# Patient Record
Sex: Female | Born: 1935 | Race: White | Hispanic: No | Smoking: Former smoker
Health system: Southern US, Community
[De-identification: ages and names within clinical notes are randomized; demographics above are authoritative.]

## PROBLEM LIST (undated history)

## (undated) DIAGNOSIS — M8580 Other specified disorders of bone density and structure, unspecified site: Secondary | ICD-10-CM

## (undated) DIAGNOSIS — Z973 Presence of spectacles and contact lenses: Secondary | ICD-10-CM

## (undated) DIAGNOSIS — I10 Essential (primary) hypertension: Secondary | ICD-10-CM

## (undated) DIAGNOSIS — M1990 Unspecified osteoarthritis, unspecified site: Secondary | ICD-10-CM

## (undated) DIAGNOSIS — E0590 Thyrotoxicosis, unspecified without thyrotoxic crisis or storm: Secondary | ICD-10-CM

## (undated) DIAGNOSIS — N950 Postmenopausal bleeding: Secondary | ICD-10-CM

## (undated) DIAGNOSIS — N840 Polyp of corpus uteri: Secondary | ICD-10-CM

## (undated) DIAGNOSIS — H269 Unspecified cataract: Secondary | ICD-10-CM

## (undated) DIAGNOSIS — E039 Hypothyroidism, unspecified: Secondary | ICD-10-CM

## (undated) DIAGNOSIS — H3530 Unspecified macular degeneration: Secondary | ICD-10-CM

## (undated) HISTORY — PX: COLONOSCOPY: SHX174

## (undated) HISTORY — DX: Other specified disorders of bone density and structure, unspecified site: M85.80

## (undated) HISTORY — DX: Essential (primary) hypertension: I10

## (undated) HISTORY — DX: Thyrotoxicosis, unspecified without thyrotoxic crisis or storm: E05.90

## (undated) HISTORY — DX: Unspecified cataract: H26.9

## (undated) HISTORY — DX: Unspecified osteoarthritis, unspecified site: M19.90

## (undated) HISTORY — PX: TOTAL HIP ARTHROPLASTY: SHX124

---

## 1998-04-13 ENCOUNTER — Ambulatory Visit (HOSPITAL_COMMUNITY): Admission: RE | Admit: 1998-04-13 | Discharge: 1998-04-13 | Payer: Self-pay | Admitting: Neurosurgery

## 1998-04-13 ENCOUNTER — Encounter: Payer: Self-pay | Admitting: Neurosurgery

## 1999-01-20 ENCOUNTER — Encounter: Admission: RE | Admit: 1999-01-20 | Discharge: 1999-01-20 | Payer: Self-pay | Admitting: Internal Medicine

## 1999-01-20 ENCOUNTER — Encounter: Payer: Self-pay | Admitting: Internal Medicine

## 2000-01-31 ENCOUNTER — Encounter: Admission: RE | Admit: 2000-01-31 | Discharge: 2000-01-31 | Payer: Self-pay | Admitting: Internal Medicine

## 2000-01-31 ENCOUNTER — Encounter: Payer: Self-pay | Admitting: Internal Medicine

## 2001-02-05 ENCOUNTER — Encounter: Payer: Self-pay | Admitting: Internal Medicine

## 2001-02-05 ENCOUNTER — Encounter: Admission: RE | Admit: 2001-02-05 | Discharge: 2001-02-05 | Payer: Self-pay | Admitting: Internal Medicine

## 2001-02-12 ENCOUNTER — Other Ambulatory Visit: Admission: RE | Admit: 2001-02-12 | Discharge: 2001-02-12 | Payer: Self-pay | Admitting: Internal Medicine

## 2002-02-06 ENCOUNTER — Encounter: Admission: RE | Admit: 2002-02-06 | Discharge: 2002-02-06 | Payer: Self-pay | Admitting: Internal Medicine

## 2002-02-06 ENCOUNTER — Encounter: Payer: Self-pay | Admitting: Internal Medicine

## 2003-03-11 ENCOUNTER — Encounter: Admission: RE | Admit: 2003-03-11 | Discharge: 2003-03-11 | Payer: Self-pay | Admitting: Internal Medicine

## 2004-02-17 ENCOUNTER — Other Ambulatory Visit: Admission: RE | Admit: 2004-02-17 | Discharge: 2004-02-17 | Payer: Self-pay | Admitting: Internal Medicine

## 2004-03-14 ENCOUNTER — Ambulatory Visit (HOSPITAL_COMMUNITY): Admission: RE | Admit: 2004-03-14 | Discharge: 2004-03-14 | Payer: Self-pay | Admitting: Internal Medicine

## 2005-03-06 HISTORY — PX: TOTAL HIP ARTHROPLASTY: SHX124

## 2005-03-28 ENCOUNTER — Ambulatory Visit (HOSPITAL_COMMUNITY): Admission: RE | Admit: 2005-03-28 | Discharge: 2005-03-28 | Payer: Self-pay | Admitting: Internal Medicine

## 2005-12-18 ENCOUNTER — Inpatient Hospital Stay (HOSPITAL_COMMUNITY): Admission: EM | Admit: 2005-12-18 | Discharge: 2005-12-21 | Payer: Self-pay | Admitting: Emergency Medicine

## 2006-03-29 ENCOUNTER — Ambulatory Visit (HOSPITAL_COMMUNITY): Admission: RE | Admit: 2006-03-29 | Discharge: 2006-03-29 | Payer: Self-pay | Admitting: Internal Medicine

## 2006-10-13 ENCOUNTER — Encounter: Admission: RE | Admit: 2006-10-13 | Discharge: 2006-10-13 | Payer: Self-pay | Admitting: Internal Medicine

## 2007-04-01 ENCOUNTER — Ambulatory Visit (HOSPITAL_COMMUNITY): Admission: RE | Admit: 2007-04-01 | Discharge: 2007-04-01 | Payer: Self-pay | Admitting: Internal Medicine

## 2007-04-25 ENCOUNTER — Other Ambulatory Visit: Admission: RE | Admit: 2007-04-25 | Discharge: 2007-04-25 | Payer: Self-pay | Admitting: Internal Medicine

## 2007-12-24 ENCOUNTER — Ambulatory Visit: Payer: Self-pay | Admitting: Internal Medicine

## 2008-01-07 ENCOUNTER — Encounter: Payer: Self-pay | Admitting: Internal Medicine

## 2008-01-07 ENCOUNTER — Ambulatory Visit: Payer: Self-pay | Admitting: Internal Medicine

## 2008-01-09 ENCOUNTER — Encounter: Payer: Self-pay | Admitting: Internal Medicine

## 2008-04-01 ENCOUNTER — Ambulatory Visit (HOSPITAL_COMMUNITY): Admission: RE | Admit: 2008-04-01 | Discharge: 2008-04-01 | Payer: Self-pay | Admitting: Internal Medicine

## 2008-04-06 ENCOUNTER — Encounter: Admission: RE | Admit: 2008-04-06 | Discharge: 2008-04-06 | Payer: Self-pay | Admitting: Internal Medicine

## 2009-04-07 ENCOUNTER — Ambulatory Visit (HOSPITAL_COMMUNITY): Admission: RE | Admit: 2009-04-07 | Discharge: 2009-04-07 | Payer: Self-pay | Admitting: Internal Medicine

## 2009-04-20 ENCOUNTER — Ambulatory Visit (HOSPITAL_COMMUNITY): Admission: RE | Admit: 2009-04-20 | Discharge: 2009-04-20 | Payer: Self-pay | Admitting: Internal Medicine

## 2009-07-04 ENCOUNTER — Inpatient Hospital Stay (HOSPITAL_COMMUNITY): Admission: EM | Admit: 2009-07-04 | Discharge: 2009-07-07 | Payer: Self-pay | Admitting: Emergency Medicine

## 2010-03-06 HISTORY — PX: CATARACT EXTRACTION, BILATERAL: SHX1313

## 2010-03-27 ENCOUNTER — Encounter: Payer: Self-pay | Admitting: Internal Medicine

## 2010-03-31 ENCOUNTER — Other Ambulatory Visit (HOSPITAL_COMMUNITY): Payer: Self-pay | Admitting: Internal Medicine

## 2010-03-31 DIAGNOSIS — Z1239 Encounter for other screening for malignant neoplasm of breast: Secondary | ICD-10-CM

## 2010-04-12 ENCOUNTER — Ambulatory Visit (HOSPITAL_COMMUNITY)
Admission: RE | Admit: 2010-04-12 | Discharge: 2010-04-12 | Disposition: A | Discharge status: Home / Self Care | Payer: Medicare Other | Source: Ambulatory Visit | Attending: Internal Medicine | Admitting: Internal Medicine

## 2010-04-12 DIAGNOSIS — Z1231 Encounter for screening mammogram for malignant neoplasm of breast: Secondary | ICD-10-CM

## 2010-04-12 DIAGNOSIS — Z1239 Encounter for other screening for malignant neoplasm of breast: Secondary | ICD-10-CM

## 2010-05-24 LAB — CROSSMATCH: Antibody Screen: POSITIVE

## 2010-05-24 LAB — COMPREHENSIVE METABOLIC PANEL
ALT: 18 U/L (ref 0–35)
Albumin: 3.6 g/dL (ref 3.5–5.2)
Alkaline Phosphatase: 40 U/L (ref 39–117)
BUN: 33 mg/dL — ABNORMAL HIGH (ref 6–23)
Chloride: 104 mEq/L (ref 96–112)
Glucose, Bld: 127 mg/dL — ABNORMAL HIGH (ref 70–99)
Potassium: 3.7 mEq/L (ref 3.5–5.1)
Sodium: 140 mEq/L (ref 135–145)
Total Bilirubin: 0.7 mg/dL (ref 0.3–1.2)
Total Protein: 7 g/dL (ref 6.0–8.3)

## 2010-05-24 LAB — BASIC METABOLIC PANEL
BUN: 18 mg/dL (ref 6–23)
CO2: 28 mEq/L (ref 19–32)
Calcium: 8.2 mg/dL — ABNORMAL LOW (ref 8.4–10.5)
Calcium: 8.2 mg/dL — ABNORMAL LOW (ref 8.4–10.5)
Creatinine, Ser: 0.86 mg/dL (ref 0.4–1.2)
GFR calc non Af Amer: 52 mL/min — ABNORMAL LOW (ref >60)
GFR calc non Af Amer: >60 mL/min (ref >60)
Glucose, Bld: 145 mg/dL — ABNORMAL HIGH (ref 70–99)
Glucose, Bld: 94 mg/dL (ref 70–99)
Potassium: 3.9 mEq/L (ref 3.5–5.1)
Sodium: 140 mEq/L (ref 135–145)

## 2010-05-24 LAB — URINALYSIS, ROUTINE W REFLEX MICROSCOPIC
Glucose, UA: NEGATIVE mg/dL
Protein, ur: NEGATIVE mg/dL
pH: 6.5 (ref 5.0–8.0)

## 2010-05-24 LAB — URINE CULTURE
Colony Count: NO GROWTH
Culture: NO GROWTH

## 2010-05-24 LAB — CBC
HCT: 29.2 % — ABNORMAL LOW (ref 36.0–46.0)
HCT: 37.1 % (ref 36.0–46.0)
Hemoglobin: 8.9 g/dL — ABNORMAL LOW (ref 12.0–15.0)
Hemoglobin: 9.8 g/dL — ABNORMAL LOW (ref 12.0–15.0)
MCHC: 33.4 g/dL (ref 30.0–36.0)
MCHC: 34.3 g/dL (ref 30.0–36.0)
MCV: 97.3 fL (ref 78.0–100.0)
MCV: 97.5 fL (ref 78.0–100.0)
Platelets: 176 10*3/uL (ref 150–400)
RBC: 3 MIL/uL — ABNORMAL LOW (ref 3.87–5.11)
RBC: 3.81 MIL/uL — ABNORMAL LOW (ref 3.87–5.11)
RDW: 14.6 % (ref 11.5–15.5)
RDW: 14.7 % (ref 11.5–15.5)
WBC: 17.4 10*3/uL — ABNORMAL HIGH (ref 4.0–10.5)

## 2010-05-24 LAB — DIFFERENTIAL
Eosinophils Absolute: 0 10*3/uL (ref 0.0–0.7)
Eosinophils Relative: 0 % (ref 0–5)
Lymphocytes Relative: 9 % — ABNORMAL LOW (ref 12–46)
Lymphs Abs: 1.5 10*3/uL (ref 0.7–4.0)
Monocytes Relative: 4 % (ref 3–12)
Neutrophils Relative %: 87 % — ABNORMAL HIGH (ref 43–77)

## 2010-05-24 LAB — SAMPLE TO BLOOD BANK

## 2010-05-24 LAB — URINE MICROSCOPIC-ADD ON
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
## 2011-03-07 HISTORY — PX: TOTAL HIP ARTHROPLASTY: SHX124

## 2011-03-13 ENCOUNTER — Other Ambulatory Visit (HOSPITAL_COMMUNITY): Payer: Self-pay | Admitting: Internal Medicine

## 2011-03-13 DIAGNOSIS — Z1231 Encounter for screening mammogram for malignant neoplasm of breast: Secondary | ICD-10-CM

## 2011-04-14 ENCOUNTER — Ambulatory Visit (HOSPITAL_COMMUNITY): Payer: Medicare Other

## 2011-05-03 ENCOUNTER — Ambulatory Visit (HOSPITAL_COMMUNITY)
Admission: RE | Admit: 2011-05-03 | Discharge: 2011-05-03 | Disposition: A | Discharge status: Home / Self Care | Payer: Medicare Other | Source: Ambulatory Visit | Attending: Internal Medicine | Admitting: Internal Medicine

## 2011-05-03 DIAGNOSIS — Z1231 Encounter for screening mammogram for malignant neoplasm of breast: Secondary | ICD-10-CM | POA: Insufficient documentation

## 2012-04-22 ENCOUNTER — Other Ambulatory Visit (HOSPITAL_COMMUNITY): Payer: Self-pay | Admitting: Internal Medicine

## 2012-04-22 DIAGNOSIS — Z1231 Encounter for screening mammogram for malignant neoplasm of breast: Secondary | ICD-10-CM

## 2012-05-06 ENCOUNTER — Ambulatory Visit (HOSPITAL_COMMUNITY): Payer: Medicare Other

## 2012-05-15 ENCOUNTER — Ambulatory Visit (HOSPITAL_COMMUNITY)
Admission: RE | Admit: 2012-05-15 | Discharge: 2012-05-15 | Disposition: A | Discharge status: Home / Self Care | Payer: Medicare Other | Source: Ambulatory Visit | Attending: Internal Medicine | Admitting: Internal Medicine

## 2012-05-15 DIAGNOSIS — Z1231 Encounter for screening mammogram for malignant neoplasm of breast: Secondary | ICD-10-CM | POA: Insufficient documentation

## 2012-07-23 ENCOUNTER — Telehealth: Payer: Self-pay | Admitting: *Deleted

## 2012-07-23 DIAGNOSIS — Z1211 Encounter for screening for malignant neoplasm of colon: Secondary | ICD-10-CM

## 2012-07-23 NOTE — Telephone Encounter (Signed)
Spoke with patient and scheduled direct colonoscopy on 08/21/12 at 2:30 PM and pre visit on 08/13/12.

## 2012-07-23 NOTE — Telephone Encounter (Signed)
Per Dr. Juanda Chance, patient needs to have direct colonoscopy. Dr. Nila Nephew called and requested due to feeling something in rectum. Called and left a message for patient to call me.

## 2012-07-23 NOTE — Telephone Encounter (Signed)
Spoke with patient's family and they will have her call me.

## 2012-07-24 ENCOUNTER — Encounter: Payer: Self-pay | Admitting: Internal Medicine

## 2012-08-13 ENCOUNTER — Ambulatory Visit (AMBULATORY_SURGERY_CENTER): Payer: Medicare Other | Admitting: *Deleted

## 2012-08-13 VITALS — Ht 63.0 in | Wt 115.4 lb

## 2012-08-13 DIAGNOSIS — Z1211 Encounter for screening for malignant neoplasm of colon: Secondary | ICD-10-CM

## 2012-08-13 DIAGNOSIS — Z860100 Personal history of colon polyps, unspecified: Secondary | ICD-10-CM

## 2012-08-13 MED ORDER — MOVIPREP 100 G PO SOLR: 1.0000 | Freq: Once | ORAL | Status: DC

## 2012-08-13 NOTE — Progress Notes (Signed)
Denies allergies to eggs or soy products. Denies complications with anesthesia or sedation. 

## 2012-08-21 ENCOUNTER — Encounter: Payer: Self-pay | Admitting: Internal Medicine

## 2012-08-21 ENCOUNTER — Ambulatory Visit (AMBULATORY_SURGERY_CENTER): Payer: Medicare Other | Admitting: Internal Medicine

## 2012-08-21 VITALS — BP 126/72 | HR 75 | Temp 97.8°F | Resp 16 | Ht 63.0 in | Wt 115.0 lb

## 2012-08-21 DIAGNOSIS — D126 Benign neoplasm of colon, unspecified: Secondary | ICD-10-CM

## 2012-08-21 DIAGNOSIS — Z1211 Encounter for screening for malignant neoplasm of colon: Secondary | ICD-10-CM

## 2012-08-21 DIAGNOSIS — Z86010 Personal history of colonic polyps: Secondary | ICD-10-CM

## 2012-08-21 MED ORDER — SODIUM CHLORIDE 0.9 % IV SOLN: 500.0000 mL | INTRAVENOUS | Status: DC

## 2012-08-21 NOTE — Progress Notes (Signed)
Called to room to assist during endoscopic procedure.  Patient ID and intended procedure confirmed with present staff. Received instructions for my participation in the procedure from the performing physician.  

## 2012-08-21 NOTE — Progress Notes (Signed)
Pt stable to RR 

## 2012-08-21 NOTE — Progress Notes (Signed)
Patient did not experience any of the following events: a burn prior to discharge; a fall within the facility; wrong site/side/patient/procedure/implant event; or a hospital transfer or hospital admission upon discharge from the facility. (G8907) Patient did not have preoperative order for IV antibiotic SSI prophylaxis. (G8918)  

## 2012-08-21 NOTE — Patient Instructions (Addendum)
METAMUCIL 1 TABLESPOON DAILY.   YOU HAD AN ENDOSCOPIC PROCEDURE TODAY AT THE [facility]: Refer to the procedure report that was given to you for any specific questions about what was found during the examination.  If the procedure report does not answer your questions, please call your gastroenterologist to clarify.  If you requested that your care partner not be given the details of your procedure findings, then the procedure report has been included in a sealed envelope for you to review at your convenience later.  YOU SHOULD EXPECT: Some feelings of bloating in the abdomen. Passage of more gas than usual.  Walking can help get rid of the air that was put into your GI tract during the procedure and reduce the bloating. If you had a lower endoscopy (such as a colonoscopy or flexible sigmoidoscopy) you may notice spotting of blood in your stool or on the toilet paper. If you underwent a bowel prep for your procedure, then you may not have a normal bowel movement for a few days.  DIET: Your first meal following the procedure should be a light meal and then it is ok to progress to your normal diet.  A half-sandwich or bowl of soup is an example of a good first meal.  Heavy or fried foods are harder to digest and may make you feel nauseous or bloated.  Likewise meals heavy in dairy and vegetables can cause extra gas to form and this can also increase the bloating.  Drink plenty of fluids but you should avoid alcoholic beverages for 24 hours.  ACTIVITY: Your care partner should take you home directly after the procedure.  You should plan to take it easy, moving slowly for the rest of the day.  You can resume normal activity the day after the procedure however you should NOT DRIVE or use heavy machinery for 24 hours (because of the sedation medicines used during the test).    SYMPTOMS TO REPORT IMMEDIATELY: A gastroenterologist can be reached at any hour.  During normal business hours, 8:30 AM  to 5:00 PM Monday through Friday, call [phone].  After hours and on weekends, please call the GI answering service at [phone] who will take a message and have the physician on call contact you.   Following lower endoscopy (colonoscopy or flexible sigmoidoscopy):  Excessive amounts of blood in the stool  Significant tenderness or worsening of abdominal pains  Swelling of the abdomen that is new, acute  Fever of 100F or higher  FOLLOW UP: If any biopsies were taken you will be contacted by phone or by letter within the next 1-3 weeks.  Call your gastroenterologist if you have not heard about the biopsies in 3 weeks.  Our staff will call the home number listed on your records the next business day following your procedure to check on you and address any questions or concerns that you may have at that time regarding the information given to you following your procedure. This is a courtesy call and so if there is no answer at the home number and we have not heard from you through the emergency physician on call, we will assume that you have returned to your regular daily activities without incident.  SIGNATURES/CONFIDENTIALITY: You and/or your care partner have signed paperwork which will be entered into your electronic medical record.  These signatures attest to the fact that that the information above on your After Visit Summary has been reviewed and is understood.  Full  responsibility of the confidentiality of this discharge information lies with you and/or your care-partner.

## 2012-08-21 NOTE — Op Note (Signed)
[facility] [address] [place], [address]   COLONOSCOPY PROCEDURE REPORT  PATIENT: Jenna Thomas, Jenna Thomas  MR#: [id number] BIRTHDATE: 1931-12-23 , 81  yrs. old GENDER: Female ENDOSCOPIST: Hart Carwin, MD REFERRED BY:  Nila Nephew, M.D. PROCEDURE DATE:  08/21/2012 PROCEDURE:   Colonoscopy with snare polypectomy ASA CLASS:   Class II INDICATIONS:adenomatous polyp in 2004 and in 2009, Dr Chilton Si did a recent rectal exam and felt a questionable abnormality.Marland Kitchen MEDICATIONS: MAC sedation, administered by CRNA and Propofol (Diprivan) 260 mg IV  DESCRIPTION OF PROCEDURE:   After the risks and benefits and of the procedure were explained, informed consent was obtained.  A digital rectal exam revealed decreased sphincter tone.    The LB [id number]-H190 [id number]  endoscope was introduced through the anus and advanced to the cecum, which was identified by both the appendix and ileocecal valve .  The quality of the prep was Moviprep fair .  The instrument was then slowly withdrawn as the colon was fully examined.     COLON FINDINGS: A polypoid shaped sessile polyp ranging between 5-37mm in size was found in the rectum.  A polypectomy was performed with a cold snare.  The resection was complete and the polyp tissue was completely retrieved.   There was severe diverticulosis noted in the sigmoid colon with associated angulation, tortuosity, muscular hypertrophy, luminal narrowing and colonic spasm. Retroflexed views revealed no abnormalities.     The scope was then withdrawn from the patient and the procedure completed.  COMPLICATIONS: There were no complications. ENDOSCOPIC IMPRESSION: 1.   Sessile polyp ranging between 5-19mm in size was found in the rectum; polypectomy was performed with a cold snare - polyp does not represents the abnormality  felt on rectal digital exam. 2.   There was severe diverticulosis noted in the sigmoid colon with severe spasm and partial  obstruction, no evidence of diverticulitis 3. the abnormality in rectal ampulla may  be a prominent coccygeal process which appears to protrude  into the rectal ampulla,  RECOMMENDATIONS: 1.await pathology report 2.Metamucil 1 tablesp daily  REPEAT EXAM: consider recall 5 years depending on pt's medical status  cc:  _______________________________ eSignedHart Carwin, MD 08/21/2012 3:12 PM     PATIENT NAME:  [name], [name] MR#: [id number]

## 2012-08-22 ENCOUNTER — Telehealth: Payer: Self-pay

## 2012-08-22 NOTE — Telephone Encounter (Signed)
  Follow up Call-  Call back number 08/21/2012  Post procedure Call Back phone  # [phone]  Permission to leave phone message Yes     Patient questions:  Do you have a fever, pain , or abdominal swelling? no Pain Score  0 *  Have you tolerated food without any problems? yes  Have you been able to return to your normal activities? yes  Do you have any questions about your discharge instructions: Diet   no Medications  no Follow up visit  no  Do you have questions or concerns about your Care? no  Actions: * If pain score is 4 or above: No action needed, pain <4.

## 2012-08-27 ENCOUNTER — Encounter: Payer: Self-pay | Admitting: Internal Medicine

## 2013-05-02 ENCOUNTER — Other Ambulatory Visit (HOSPITAL_COMMUNITY): Payer: Self-pay | Admitting: Internal Medicine

## 2013-05-02 DIAGNOSIS — Z1231 Encounter for screening mammogram for malignant neoplasm of breast: Secondary | ICD-10-CM

## 2013-05-20 ENCOUNTER — Ambulatory Visit (HOSPITAL_COMMUNITY)
Admission: RE | Admit: 2013-05-20 | Discharge: 2013-05-20 | Disposition: A | Discharge status: Home / Self Care | Payer: Medicare Other | Source: Ambulatory Visit | Attending: Internal Medicine | Admitting: Internal Medicine

## 2013-05-20 DIAGNOSIS — Z1231 Encounter for screening mammogram for malignant neoplasm of breast: Secondary | ICD-10-CM

## 2013-06-22 ENCOUNTER — Emergency Department (HOSPITAL_COMMUNITY)
Admission: EM | Admit: 2013-06-22 | Discharge: 2013-06-22 | Disposition: A | Discharge status: Home / Self Care | Payer: Medicare Other | Attending: Emergency Medicine | Admitting: Emergency Medicine

## 2013-06-22 ENCOUNTER — Emergency Department (HOSPITAL_COMMUNITY): Payer: Medicare Other

## 2013-06-22 ENCOUNTER — Encounter (HOSPITAL_COMMUNITY): Payer: Self-pay | Admitting: Emergency Medicine

## 2013-06-22 DIAGNOSIS — E0590 Thyrotoxicosis, unspecified without thyrotoxic crisis or storm: Secondary | ICD-10-CM | POA: Insufficient documentation

## 2013-06-22 DIAGNOSIS — M949 Disorder of cartilage, unspecified: Secondary | ICD-10-CM

## 2013-06-22 DIAGNOSIS — G8929 Other chronic pain: Secondary | ICD-10-CM | POA: Insufficient documentation

## 2013-06-22 DIAGNOSIS — IMO0002 Reserved for concepts with insufficient information to code with codable children: Secondary | ICD-10-CM | POA: Insufficient documentation

## 2013-06-22 DIAGNOSIS — Z87891 Personal history of nicotine dependence: Secondary | ICD-10-CM | POA: Insufficient documentation

## 2013-06-22 DIAGNOSIS — Z79899 Other long term (current) drug therapy: Secondary | ICD-10-CM | POA: Insufficient documentation

## 2013-06-22 DIAGNOSIS — M5416 Radiculopathy, lumbar region: Secondary | ICD-10-CM

## 2013-06-22 DIAGNOSIS — I10 Essential (primary) hypertension: Secondary | ICD-10-CM | POA: Insufficient documentation

## 2013-06-22 DIAGNOSIS — M479 Spondylosis, unspecified: Secondary | ICD-10-CM | POA: Insufficient documentation

## 2013-06-22 DIAGNOSIS — M25552 Pain in left hip: Secondary | ICD-10-CM

## 2013-06-22 DIAGNOSIS — Z8669 Personal history of other diseases of the nervous system and sense organs: Secondary | ICD-10-CM | POA: Insufficient documentation

## 2013-06-22 DIAGNOSIS — M25559 Pain in unspecified hip: Secondary | ICD-10-CM | POA: Insufficient documentation

## 2013-06-22 DIAGNOSIS — Z96649 Presence of unspecified artificial hip joint: Secondary | ICD-10-CM | POA: Insufficient documentation

## 2013-06-22 DIAGNOSIS — M899 Disorder of bone, unspecified: Secondary | ICD-10-CM | POA: Insufficient documentation

## 2013-06-22 MED ORDER — HYDROCODONE-ACETAMINOPHEN 5-325 MG PO TABS
1.0000 | ORAL_TABLET | Freq: Once | ORAL | Status: AC
  Administered 2013-06-22: 1 via ORAL
  Filled 2013-06-22: qty 1

## 2013-06-22 MED ORDER — ONDANSETRON HCL 4 MG PO TABS: 4.0000 mg | ORAL_TABLET | Freq: Three times a day (TID) | ORAL | Status: DC | PRN

## 2013-06-22 MED ORDER — HYDROCODONE-ACETAMINOPHEN 5-325 MG PO TABS: 1.0000 | ORAL_TABLET | Freq: Four times a day (QID) | ORAL | Status: DC | PRN

## 2013-06-22 MED ORDER — ONDANSETRON 8 MG PO TBDP
8.0000 mg | ORAL_TABLET | Freq: Once | ORAL | Status: AC
  Administered 2013-06-22: 8 mg via ORAL
  Filled 2013-06-22: qty 1

## 2013-06-22 NOTE — Discharge Instructions (Signed)
Read the information below.  Use the prescribed medication as directed.  Please discuss all new medications with your pharmacist.  Do not take additional tylenol while taking the prescribed pain medication to avoid overdose.  You may return to the Emergency Department at any time for worsening condition or any new symptoms that concern you.   If you develop fevers, loss of control of bowel or bladder, weakness or numbness in your legs, or are unable to walk, return to the ER for a recheck. If you develop uncontrolled pain, weakness or numbness of the extremity, severe discoloration of the skin, or you are unable to walk, return to the ER for a recheck.      Lumbosacral Radiculopathy Lumbosacral radiculopathy is a pinched nerve or nerves in the low back (lumbosacral area). When this happens you may have weakness in your legs and may not be able to stand on your toes. You may have pain going down into your legs. There may be difficulties with walking normally. There are many causes of this problem. Sometimes this may happen from an injury, or simply from arthritis or boney problems. It may also be caused by other illnesses such as diabetes. If there is no improvement after treatment, further studies may be done to find the exact cause. DIAGNOSIS  X-rays may be needed if the problems become long standing. Electromyograms may be done. This study is one in which the working of nerves and muscles is studied. HOME CARE INSTRUCTIONS   Applications of ice packs may be helpful. Ice can be used in a plastic bag with a towel around it to prevent frostbite to skin. This may be used every 2 hours for 20 to 30 minutes, or as needed, while awake, or as directed by your caregiver.  Only take over-the-counter or prescription medicines for pain, discomfort, or fever as directed by your caregiver.  If physical therapy was prescribed, follow your caregiver's directions. SEEK IMMEDIATE MEDICAL CARE IF:   You have pain not  controlled with medications.  You seem to be getting worse rather than better.  You develop increasing weakness in your legs.  You develop loss of bowel or bladder control.  You have difficulty with walking or balance, or develop clumsiness in the use of your legs.  You have a fever. MAKE SURE YOU:   Understand these instructions.  Will watch your condition.  Will get help right away if you are not doing well or get worse. Document Released: 02/20/2005 Document Revised: 05/15/2011 Document Reviewed: 10/11/2007 [facility] Patient Information 2014 [place].  Hip Pain The hips join the upper legs to the lower pelvis. The bones, cartilage, tendons, and muscles of the hip joint perform a lot of work each day holding your body weight and allowing you to move around. Hip pain is a common symptom. It can range from a minor ache to severe pain on 1 or both hips. Pain may be felt on the inside of the hip joint near the groin, or the outside near the buttocks and upper thigh. There may be swelling or stiffness as well. It occurs more often when a person walks or performs activity. There are many reasons hip pain can develop. CAUSES  It is important to work with your caregiver to identify the cause since many conditions can impact the bones, cartilage, muscles, and tendons of the hips. Causes for hip pain include:  Broken (fractured) bones.  Separation of the thighbone from the hip socket (dislocation).  Torn cartilage  of the hip joint.  Swelling (inflammation) of a tendon (tendonitis), the sac within the hip joint (bursitis), or a joint.  A weakening in the abdominal wall (hernia), affecting the nerves to the hip.  Arthritis in the hip joint or lining of the hip joint.  Pinched nerves in the back, hip, or upper thigh.  A bulging disc in the spine (herniated disc).  Rarely, bone infection or cancer. DIAGNOSIS  The location of your hip pain will help your caregiver understand  what may be causing the pain. A diagnosis is based on your medical history, your symptoms, results from your physical exam, and results from diagnostic tests. Diagnostic tests may include X-ray exams, a computerized magnetic scan (magnetic resonance imaging, MRI), or bone scan. TREATMENT  Treatment will depend on the cause of your hip pain. Treatment may include:  Limiting activities and resting until symptoms improve.  Crutches or other walking supports (a cane or brace).  Ice, elevation, and compression.  Physical therapy or home exercises.  Shoe inserts or special shoes.  Losing weight.  Medications to reduce pain.  Undergoing surgery. HOME CARE INSTRUCTIONS   Only take over-the-counter or prescription medicines for pain, discomfort, or fever as directed by your caregiver.  Put ice on the injured area:  Put ice in a plastic bag.  Place a towel between your skin and the bag.  Leave the ice on for 15-20 minutes at a time, 03-04 times a day.  Keep your leg raised (elevated) when possible to lessen swelling.  Avoid activities that cause pain.  Follow specific exercises as directed by your caregiver.  Sleep with a pillow between your legs on your most comfortable side.  Record how often you have hip pain, the location of the pain, and what it feels like. This information may be helpful to you and your caregiver.  Ask your caregiver about returning to work or sports and whether you should drive.  Follow up with your caregiver for further exams, therapy, or testing as directed. SEEK MEDICAL CARE IF:   Your pain or swelling continues or worsens after 1 week.  You are feeling unwell or have chills.  You have increasing difficulty with walking.  You have a loss of sensation or other new symptoms.  You have questions or concerns. SEEK IMMEDIATE MEDICAL CARE IF:   You cannot put weight on the affected hip.  You have fallen.  You have a sudden increase in pain and  swelling in your hip.  You have a fever. MAKE SURE YOU:   Understand these instructions.  Will watch your condition.  Will get help right away if you are not doing well or get worse. Document Released: 08/10/2009 Document Revised: 05/15/2011 Document Reviewed: 08/10/2009 [facility] Patient Information 2014 [place].

## 2013-06-22 NOTE — ED Provider Notes (Signed)
Complains of left-sided para sacral pain radiating the left hip onset 10 PM last night no fever no trauma no loss of bladder or bowel control. No other symptoms associated. Pain is worse with walking improved with laying still it herself with Aleve with minimal relief. On exam alert nontoxic back without point tenderness or flank tenderness all 4 extremities without redness swelling or tenderness neurovascular intact gait she walks with a slight limp favoring her left leg  Orlie Dakin, MD 06/22/13 [phone]

## 2013-06-22 NOTE — ED Notes (Addendum)
Pt c/o L hip pain, denies trauma, denies hearing or feeling "pop" pt has bilat hip replacements. No shortening or rotation noted.

## 2013-06-22 NOTE — ED Notes (Signed)
Pt states her left hip is hurting unable to quantify and give quality of pain,  States she got up from chair last night and felt a terrible pain.and it has never gone awat

## 2013-06-22 NOTE — ED Notes (Signed)
She is awake, alert and pleasantly visiting with her husband.  She has no requests at this time; and specifically states her nausea is minimal.

## 2013-06-22 NOTE — ED Provider Notes (Signed)
CSN: [id number]     Arrival date & time 06/22/13  0607 History   First MD Initiated Contact with Patient 06/22/13 0631     Chief Complaint  Patient presents with  . Hip Pain     (Consider location/radiation/quality/duration/timing/severity/associated sxs/prior Treatment) The history is provided by the patient and the spouse.    Pt with hx chronic low back problems (stenosis, arthritis) and bilateral hip replacements p/w left hip pain that began at 10pm yesterday when she got up from a chair.  Reports pain has been aching, 8/10 intensity, intermittent, not relieved with aleve, radiates down her left leg.  Denies fevers, recent illness, abdominal pain, urinary or bowel symptoms, bowel or bladder incontinence or retention, saddle anesthesia.  Denies recent falls or injury or change in activity level.  Hip replacement by Dr Alvan Dame.  (left 2007, right 2011)  Low back followed by Dr Christella Noa.   Past Medical History  Diagnosis Date  . Arthritis     back  . Cataracts, bilateral   . Hypertension   . Osteopenia   . Hyperthyroidism    Past Surgical History  Procedure Laterality Date  . Cataract extraction, bilateral  2012  . Total hip arthroplasty  2013    right  . Total hip arthroplasty Left 2007   Family History  Problem Relation Age of Onset  . Colon cancer Neg Hx   . Esophageal cancer Neg Hx   . Rectal cancer Neg Hx   . Stomach cancer Neg Hx    History  Substance Use Topics  . Smoking status: Former Smoker    Quit date: 03/06/1993  . Smokeless tobacco: Never Used  . Alcohol Use: 0.6 - 1.2 oz/week    1-2 Glasses of wine per week   OB History   Grav Para Term Preterm Abortions TAB SAB Ect Mult Living                 Review of Systems  Constitutional: Negative for fever and chills.  Respiratory: Negative for cough and shortness of breath.   Cardiovascular: Negative for chest pain.  Gastrointestinal: Negative for nausea, vomiting, abdominal pain and diarrhea.  Genitourinary:  Negative for dysuria, urgency, frequency, vaginal bleeding and vaginal discharge.  Musculoskeletal: Positive for arthralgias and back pain (unchanged).  All other systems reviewed and are negative.     Allergies  Sulfonamide derivatives  Home Medications   Prior to Admission medications   Medication Sig Start Date End Date Taking? Authorizing Provider  amitriptyline (ELAVIL) 25 MG tablet Take 25 mg by mouth at bedtime.   Yes Historical Provider, MD  calcium carbonate (OS-CAL) 600 MG TABS Take 600 mg by mouth 2 (two) times daily with a meal.   Yes Historical Provider, MD  carboxymethylcellulose (REFRESH PLUS) 0.5 % SOLN Place 1 drop into both eyes 3 (three) times daily as needed (for dry eyes).   Yes Historical Provider, MD  levothyroxine (SYNTHROID, LEVOTHROID) 75 MCG tablet Take 75 mcg by mouth daily before breakfast.   Yes Historical Provider, MD  losartan-hydrochlorothiazide (HYZAAR) 100-25 MG per tablet Take 1 tablet by mouth daily.   Yes Historical Provider, MD   BP 161/76  Pulse 82  Temp(Src) 97.7 F (36.5 C) (Oral)  Resp 19  Ht 5\' 3"  (1.6 m)  Wt 118 lb (53.524 kg)  BMI 20.91 kg/m2  SpO2 100% Physical Exam  Nursing note and vitals reviewed. Constitutional: She appears well-developed and well-nourished. No distress.  HENT:  Head: Normocephalic and atraumatic.  Neck:  Neck supple.  Pulmonary/Chest: Effort normal.  Abdominal: Soft. She exhibits no distension and no mass. There is no tenderness. There is no rebound and no guarding.  Musculoskeletal:       Left hip: She exhibits normal range of motion, normal strength, no tenderness, no bony tenderness, no swelling, no crepitus, no deformity and no laceration.   Spine nontender, no crepitus, or stepoffs.  Scoliosis.  Lower extremities:  Strength 5/5, sensation intact, distal pulses intact.    No lower extremity edema  Neurological: She is alert.  Skin: She is not diaphoretic.    ED Course  Procedures (including  critical care time) Labs Review Labs Reviewed - No data to display  Imaging Review Dg Lumbar Spine Complete  06/22/2013   CLINICAL DATA:  Left hip pain for 2 days without trauma. Bilateral hip replacement.  EXAM: LUMBAR SPINE - COMPLETE 4+ VIEW  COMPARISON:  MRI 10/13/2006  FINDINGS: Mild convex left lumbar spine curvature. Osteopenia. Sacroiliac joints are symmetric. Maintenance of vertebral body height. Nonspecific straightening of expected lordosis. Advanced degenerative disc disease at L3 through S1. Similar trace L3-4 retrolisthesis, likely secondary. Aortic atherosclerosis.  IMPRESSION: Advanced lumbar spondylosis, without acute superimposed process.   Electronically Signed   By: Abigail Miyamoto M.D.   On: 06/22/2013 07:46   Dg Hip Complete Left  06/22/2013   CLINICAL DATA:  Left hip pain for 2 days without trauma. Bilateral hip replacement.  EXAM: LEFT HIP - COMPLETE 2+ VIEW  COMPARISON:  12/18/2005  FINDINGS: Bilateral hip arthroplasties. Osteopenia. Convex left lower lumbar per spine curvature with advanced spondylosis/degenerative disc disease.  No complication of hip arthroplasty. No acute fracture about the left hip.  IMPRESSION: Bilateral hip arthroplasties, without acute finding.  Advanced lower lumbar spondylosis/degenerative disc disease. Question referred pain from the lumbar spine.   Electronically Signed   By: Abigail Miyamoto M.D.   On: 06/22/2013 07:44     EKG Interpretation None      7:24 AM Discussed pt with Dr Winfred Leeds who will also see the patient.   MDM   Final diagnoses:  Lumbar radiculopathy  Left hip pain    Pt with chronic low back problems (MRI reviewed) and bilateral hip replacements p/w left buttock and hip pain without injury or fall.  No red flags for back pain.  Neurovascularly intact.  Afebrile, nontoxic.  Xrays unremarkable.  She is able to ambulate without difficulty.  Pt d/c home with norco, zofran.  PCP follow up.  Pt also has orthopedist, neurosurgeon if  needed.  Discussed result, findings, treatment, and follow up  with patient.  Pt given return precautions.  Pt verbalizes understanding and agrees with plan.        Clayton Bibles, PA-C 06/22/13 1120

## 2013-06-24 NOTE — ED Provider Notes (Signed)
Medical screening examination/treatment/procedure(s) were performed by non-physician practitioner and as supervising physician I was immediately available for consultation/collaboration.   EKG Interpretation None       Varney Biles, MD 06/24/13 [phone]

## 2013-09-30 ENCOUNTER — Other Ambulatory Visit: Payer: Self-pay | Admitting: Dermatology

## 2013-11-05 ENCOUNTER — Encounter: Payer: Self-pay | Admitting: Internal Medicine

## 2014-06-23 ENCOUNTER — Other Ambulatory Visit: Payer: Self-pay

## 2014-06-23 ENCOUNTER — Other Ambulatory Visit (HOSPITAL_COMMUNITY): Payer: Self-pay | Admitting: Internal Medicine

## 2014-06-23 DIAGNOSIS — Z1231 Encounter for screening mammogram for malignant neoplasm of breast: Secondary | ICD-10-CM

## 2014-07-01 ENCOUNTER — Ambulatory Visit (HOSPITAL_COMMUNITY)
Admission: RE | Admit: 2014-07-01 | Discharge: 2014-07-01 | Disposition: A | Discharge status: Home / Self Care | Payer: Medicare Other | Source: Ambulatory Visit | Attending: Internal Medicine | Admitting: Internal Medicine

## 2014-07-01 DIAGNOSIS — Z1231 Encounter for screening mammogram for malignant neoplasm of breast: Secondary | ICD-10-CM | POA: Insufficient documentation

## 2015-03-09 ENCOUNTER — Other Ambulatory Visit: Payer: Self-pay | Admitting: Internal Medicine

## 2015-03-09 ENCOUNTER — Ambulatory Visit
Admission: RE | Admit: 2015-03-09 | Discharge: 2015-03-09 | Disposition: A | Discharge status: Home / Self Care | Payer: Medicare Other | Source: Ambulatory Visit | Attending: Internal Medicine | Admitting: Internal Medicine

## 2015-03-09 DIAGNOSIS — R05 Cough: Secondary | ICD-10-CM

## 2015-03-09 DIAGNOSIS — R059 Cough, unspecified: Secondary | ICD-10-CM

## 2015-05-25 ENCOUNTER — Other Ambulatory Visit: Payer: Self-pay

## 2015-05-25 DIAGNOSIS — Z1231 Encounter for screening mammogram for malignant neoplasm of breast: Secondary | ICD-10-CM

## 2015-07-05 ENCOUNTER — Ambulatory Visit
Admission: RE | Admit: 2015-07-05 | Discharge: 2015-07-05 | Disposition: A | Discharge status: Home / Self Care | Payer: Medicare Other | Source: Ambulatory Visit

## 2015-07-05 DIAGNOSIS — Z1231 Encounter for screening mammogram for malignant neoplasm of breast: Secondary | ICD-10-CM

## 2016-06-12 ENCOUNTER — Other Ambulatory Visit: Payer: Self-pay | Admitting: Internal Medicine

## 2016-06-12 DIAGNOSIS — Z1231 Encounter for screening mammogram for malignant neoplasm of breast: Secondary | ICD-10-CM

## 2016-07-05 ENCOUNTER — Ambulatory Visit: Payer: Medicare Other

## 2016-07-24 ENCOUNTER — Ambulatory Visit
Admission: RE | Admit: 2016-07-24 | Discharge: 2016-07-24 | Disposition: A | Discharge status: Home / Self Care | Payer: Medicare Other | Source: Ambulatory Visit | Attending: Internal Medicine | Admitting: Internal Medicine

## 2016-07-24 DIAGNOSIS — Z1231 Encounter for screening mammogram for malignant neoplasm of breast: Secondary | ICD-10-CM

## 2016-09-18 ENCOUNTER — Other Ambulatory Visit: Payer: Self-pay | Admitting: Internal Medicine

## 2016-09-18 DIAGNOSIS — R599 Enlarged lymph nodes, unspecified: Secondary | ICD-10-CM

## 2016-09-19 ENCOUNTER — Ambulatory Visit
Admission: RE | Admit: 2016-09-19 | Discharge: 2016-09-19 | Disposition: A | Discharge status: Home / Self Care | Payer: Medicare Other | Source: Ambulatory Visit | Attending: Internal Medicine | Admitting: Internal Medicine

## 2016-09-19 DIAGNOSIS — R599 Enlarged lymph nodes, unspecified: Secondary | ICD-10-CM

## 2016-09-19 MED ORDER — IOPAMIDOL (ISOVUE-300) INJECTION 61%
75.0000 mL | Freq: Once | INTRAVENOUS | Status: AC | PRN
  Administered 2016-09-19: 75 mL via INTRAVENOUS

## 2017-06-18 ENCOUNTER — Other Ambulatory Visit: Payer: Self-pay | Admitting: Internal Medicine

## 2017-06-18 DIAGNOSIS — Z1231 Encounter for screening mammogram for malignant neoplasm of breast: Secondary | ICD-10-CM

## 2017-07-03 ENCOUNTER — Encounter: Payer: Self-pay | Admitting: Internal Medicine

## 2017-07-31 ENCOUNTER — Ambulatory Visit
Admission: RE | Admit: 2017-07-31 | Discharge: 2017-07-31 | Disposition: A | Discharge status: Home / Self Care | Payer: Medicare Other | Source: Ambulatory Visit | Attending: Internal Medicine | Admitting: Internal Medicine

## 2017-07-31 DIAGNOSIS — Z1231 Encounter for screening mammogram for malignant neoplasm of breast: Secondary | ICD-10-CM

## 2018-09-19 ENCOUNTER — Other Ambulatory Visit: Payer: Self-pay | Admitting: Internal Medicine

## 2018-09-19 DIAGNOSIS — R1900 Intra-abdominal and pelvic swelling, mass and lump, unspecified site: Secondary | ICD-10-CM

## 2018-09-23 ENCOUNTER — Ambulatory Visit
Admission: RE | Admit: 2018-09-23 | Discharge: 2018-09-23 | Disposition: A | Discharge status: Home / Self Care | Payer: Medicare Other | Source: Ambulatory Visit | Attending: Internal Medicine | Admitting: Internal Medicine

## 2018-09-23 ENCOUNTER — Other Ambulatory Visit: Payer: Self-pay

## 2018-09-23 DIAGNOSIS — R1900 Intra-abdominal and pelvic swelling, mass and lump, unspecified site: Secondary | ICD-10-CM

## 2018-09-23 MED ORDER — IOPAMIDOL (ISOVUE-300) INJECTION 61%
60.0000 mL | Freq: Once | INTRAVENOUS | Status: AC | PRN
  Administered 2018-09-23: 60 mL via INTRAVENOUS

## 2018-10-01 ENCOUNTER — Other Ambulatory Visit: Payer: Self-pay | Admitting: Internal Medicine

## 2018-10-01 DIAGNOSIS — R1900 Intra-abdominal and pelvic swelling, mass and lump, unspecified site: Secondary | ICD-10-CM

## 2018-10-07 ENCOUNTER — Ambulatory Visit
Admission: RE | Admit: 2018-10-07 | Discharge: 2018-10-07 | Disposition: A | Discharge status: Home / Self Care | Payer: Medicare Other | Source: Ambulatory Visit | Attending: Internal Medicine | Admitting: Internal Medicine

## 2018-10-07 DIAGNOSIS — R1900 Intra-abdominal and pelvic swelling, mass and lump, unspecified site: Secondary | ICD-10-CM

## 2018-10-14 NOTE — H&P (Signed)
NAME: Jenna Thomas, [place] MEDICAL RECORD AQ:[id number] ACCOUNT [id number] DATE OF BIRTH:[birth year] FACILITY: [facility] LOCATION:  PHYSICIAN:Tynisa Vohs Garry Heater, MD  HISTORY AND PHYSICAL  DATE OF ADMISSION:  10/24/2018  CHIEF COMPLAINT:  Possible endometrial polyp.  HISTORY OF PRESENT ILLNESS:  An 85 year old postmenopausal patient who saw her PCP several weeks ago with complaints of some rectal pressure, and they felt like on rectal exam, she may have a possible mass, so the CT that was ordered that was normal, but  they could not really visualize the pelvis well because of her bilateral hip replacement and they therefore recommended ultrasound to be done which showed some fluid within the canal and a 5 mm polyp.  She has never been on HRT nor she noticed any  postmenopausal bleeding.  She presents at this time for D and C, hysteroscopy with Myosure.  This procedure including risks regarding bleeding, infection, other complications such as perforation that may require additional surgery discussed with her,  which she understands and accepts.  ALLERGIES:  None.  MEDICATIONS:  Losartan/HCTZ 100/25, spironolactone 25 mg daily, Synthroid 75 mcg.  Her last mammogram was earlier this year.  Colonoscopy last 2014.  She is a G4 P4.  SOCIAL HISTORY:  Moderate alcohol intake.  She is married.  Not currently sexually active.  She is a nonsmoker.  PAST SURGICAL HISTORY:  Four vaginal deliveries and has had a hip replacement surgery.  PHYSICAL EXAMINATION: VITAL SIGNS:  Blood pressure 138/72.  BMI is 18.5.   HEENT:  Unremarkable. NECK:  Supple, without masses.  Thyroid nonpalpable. HEART and LUNGS:  Clear. BREAST EXAM:  Negative. ABDOMEN:  Soft, flat, nontender. PELVIC:  Vulva, vagina and cervix normal.  Atrophic changes noted.  Uterus small, midposition.  Adnexa negative.  ASSESSMENT AND  PLAN:  Endometrial polyp noted on recent pelvic ultrasound, no postmenopausal bleeding noted.  PLAN:  D and C,  hysteroscopy with Myosure procedure and risks discussed as above.  AN/NUANCE  D:10/14/2018 T:10/14/2018 JOB:[id number]

## 2018-10-21 ENCOUNTER — Other Ambulatory Visit (HOSPITAL_COMMUNITY)
Admission: RE | Admit: 2018-10-21 | Discharge: 2018-10-21 | Disposition: A | Discharge status: Home / Self Care | Payer: Medicare Other | Source: Ambulatory Visit | Attending: Obstetrics and Gynecology | Admitting: Obstetrics and Gynecology

## 2018-10-21 DIAGNOSIS — Z20828 Contact with and (suspected) exposure to other viral communicable diseases: Secondary | ICD-10-CM | POA: Diagnosis not present

## 2018-10-21 DIAGNOSIS — Z01812 Encounter for preprocedural laboratory examination: Secondary | ICD-10-CM | POA: Diagnosis present

## 2018-10-21 LAB — SARS CORONAVIRUS 2 (TAT 6-24 HRS): SARS Coronavirus 2: NEGATIVE

## 2018-10-22 ENCOUNTER — Other Ambulatory Visit: Payer: Self-pay

## 2018-10-22 ENCOUNTER — Encounter (HOSPITAL_BASED_OUTPATIENT_CLINIC_OR_DEPARTMENT_OTHER): Payer: Self-pay | Admitting: *Deleted

## 2018-10-22 NOTE — Progress Notes (Addendum)
Spoke w/ pt via phone for pre-op interview.  Npo after mn.  Arrive at 0530.  Needs istat8, ekg and t&s.   Pt had covid test done 10-21-2018.  Will take synthroid am dos w/ sips of water.

## 2018-10-23 NOTE — Anesthesia Preprocedure Evaluation (Addendum)
Anesthesia Evaluation  Patient identified by MRN, date of birth, ID band Patient awake    Reviewed: Allergy & Precautions, NPO status , Patient's Chart, lab work & pertinent test results  History of Anesthesia Complications Negative for: history of anesthetic complications  Airway Mallampati: II  TM Distance: >3 FB Neck ROM: Full    Dental  (+) Chipped, Dental Advisory Given   Pulmonary former smoker (quit 1997),  10/21/2018 SARS coronavirus NEG   breath sounds clear to auscultation       Cardiovascular hypertension, Pt. on medications (-) angina Rhythm:Regular Rate:Normal     Neuro/Psych negative neurological ROS     GI/Hepatic negative GI ROS, Neg liver ROS,   Endo/Other  Hypothyroidism   Renal/GU negative Renal ROS     Musculoskeletal  (+) Arthritis ,   Abdominal   Peds  Hematology negative hematology ROS (+)   Anesthesia Other Findings   Reproductive/Obstetrics                            Anesthesia Physical Anesthesia Plan  ASA: II  Anesthesia Plan: General   Post-op Pain Management:    Induction: Intravenous  PONV Risk Score and Plan: 2 and Dexamethasone and Ondansetron  Airway Management Planned: LMA  Additional Equipment:   Intra-op Plan:   Post-operative Plan:   Informed Consent: I have reviewed the patients History and Physical, chart, labs and discussed the procedure including the risks, benefits and alternatives for the proposed anesthesia with the patient or authorized representative who has indicated his/her understanding and acceptance.     Dental advisory given  Plan Discussed with: CRNA and Surgeon  Anesthesia Plan Comments:        Anesthesia Quick Evaluation

## 2018-10-24 ENCOUNTER — Ambulatory Visit (HOSPITAL_BASED_OUTPATIENT_CLINIC_OR_DEPARTMENT_OTHER): Payer: Medicare Other | Admitting: Anesthesiology

## 2018-10-24 ENCOUNTER — Ambulatory Visit (HOSPITAL_BASED_OUTPATIENT_CLINIC_OR_DEPARTMENT_OTHER)
Admission: RE | Admit: 2018-10-24 | Discharge: 2018-10-24 | Disposition: A | Discharge status: Home / Self Care | Payer: Medicare Other | Attending: Obstetrics and Gynecology | Admitting: Obstetrics and Gynecology

## 2018-10-24 ENCOUNTER — Encounter (HOSPITAL_BASED_OUTPATIENT_CLINIC_OR_DEPARTMENT_OTHER)
Admission: RE | Disposition: A | Discharge status: Home / Self Care | Payer: Self-pay | Source: Home / Self Care | Attending: Obstetrics and Gynecology

## 2018-10-24 ENCOUNTER — Encounter (HOSPITAL_BASED_OUTPATIENT_CLINIC_OR_DEPARTMENT_OTHER): Payer: Self-pay

## 2018-10-24 DIAGNOSIS — I10 Essential (primary) hypertension: Secondary | ICD-10-CM | POA: Diagnosis not present

## 2018-10-24 DIAGNOSIS — Z79890 Hormone replacement therapy (postmenopausal): Secondary | ICD-10-CM | POA: Diagnosis not present

## 2018-10-24 DIAGNOSIS — E039 Hypothyroidism, unspecified: Secondary | ICD-10-CM | POA: Insufficient documentation

## 2018-10-24 DIAGNOSIS — N840 Polyp of corpus uteri: Secondary | ICD-10-CM | POA: Insufficient documentation

## 2018-10-24 DIAGNOSIS — Z79899 Other long term (current) drug therapy: Secondary | ICD-10-CM | POA: Diagnosis not present

## 2018-10-24 DIAGNOSIS — D269 Other benign neoplasm of uterus, unspecified: Secondary | ICD-10-CM

## 2018-10-24 DIAGNOSIS — Z96643 Presence of artificial hip joint, bilateral: Secondary | ICD-10-CM | POA: Insufficient documentation

## 2018-10-24 DIAGNOSIS — Z87891 Personal history of nicotine dependence: Secondary | ICD-10-CM | POA: Insufficient documentation

## 2018-10-24 HISTORY — DX: Presence of spectacles and contact lenses: Z97.3

## 2018-10-24 HISTORY — DX: Hypothyroidism, unspecified: E03.9

## 2018-10-24 HISTORY — DX: Postmenopausal bleeding: N95.0

## 2018-10-24 HISTORY — DX: Unspecified macular degeneration: H35.30

## 2018-10-24 HISTORY — PX: DILATATION & CURETTAGE/HYSTEROSCOPY WITH MYOSURE: SHX6511

## 2018-10-24 HISTORY — DX: Polyp of corpus uteri: N84.0

## 2018-10-24 LAB — POCT I-STAT, CHEM 8
BUN: 38 mg/dL — ABNORMAL HIGH (ref 8–23)
Calcium, Ion: 1.34 mmol/L (ref 1.15–1.40)
Chloride: 102 mmol/L (ref 98–111)
Creatinine, Ser: 1.4 mg/dL — ABNORMAL HIGH (ref 0.44–1.00)
Glucose, Bld: 113 mg/dL — ABNORMAL HIGH (ref 70–99)
HCT: 41 % (ref 36.0–46.0)
Hemoglobin: 13.9 g/dL (ref 12.0–15.0)
Potassium: 4 mmol/L (ref 3.5–5.1)
Sodium: 138 mmol/L (ref 135–145)
TCO2: 24 mmol/L (ref 22–32)

## 2018-10-24 LAB — TYPE AND SCREEN
ABO/RH(D): A POS
Antibody Screen: NEGATIVE

## 2018-10-24 SURGERY — DILATATION & CURETTAGE/HYSTEROSCOPY WITH MYOSURE
Anesthesia: General | Site: Uterus

## 2018-10-24 MED ORDER — LACTATED RINGERS IV SOLN
INTRAVENOUS | Status: DC
  Administered 2018-10-24: 06:00:00 via INTRAVENOUS
  Filled 2018-10-24: qty 1000

## 2018-10-24 MED ORDER — SODIUM CHLORIDE 0.9 % IR SOLN
Status: DC | PRN
  Administered 2018-10-24: 3000 mL

## 2018-10-24 MED ORDER — LIDOCAINE HCL 1 % IJ SOLN
INTRAMUSCULAR | Status: DC | PRN
  Administered 2018-10-24: 1 mL

## 2018-10-24 MED ORDER — EPHEDRINE SULFATE 50 MG/ML IJ SOLN
INTRAMUSCULAR | Status: DC | PRN
  Administered 2018-10-24: 5 mg via INTRAVENOUS

## 2018-10-24 MED ORDER — MIDAZOLAM HCL 2 MG/2ML IJ SOLN
INTRAMUSCULAR | Status: AC
  Filled 2018-10-24: qty 2

## 2018-10-24 MED ORDER — FENTANYL CITRATE (PF) 100 MCG/2ML IJ SOLN
INTRAMUSCULAR | Status: AC
  Filled 2018-10-24: qty 2

## 2018-10-24 MED ORDER — PROPOFOL 10 MG/ML IV BOLUS
INTRAVENOUS | Status: AC
  Filled 2018-10-24: qty 20

## 2018-10-24 MED ORDER — FENTANYL CITRATE (PF) 100 MCG/2ML IJ SOLN
INTRAMUSCULAR | Status: DC | PRN
  Administered 2018-10-24 (×2): 25 ug via INTRAVENOUS

## 2018-10-24 MED ORDER — ONDANSETRON HCL 4 MG/2ML IJ SOLN
INTRAMUSCULAR | Status: DC | PRN
  Administered 2018-10-24: 4 mg via INTRAVENOUS

## 2018-10-24 MED ORDER — FENTANYL CITRATE (PF) 100 MCG/2ML IJ SOLN
25.0000 ug | INTRAMUSCULAR | Status: DC | PRN
  Filled 2018-10-24: qty 1

## 2018-10-24 MED ORDER — LIDOCAINE HCL 1 % IJ SOLN
INTRAMUSCULAR | Status: DC | PRN
  Administered 2018-10-24: 30 mg via INTRADERMAL

## 2018-10-24 MED ORDER — LACTATED RINGERS IV SOLN
INTRAVENOUS | Status: DC
  Filled 2018-10-24: qty 1000

## 2018-10-24 MED ORDER — PROPOFOL 10 MG/ML IV BOLUS
INTRAVENOUS | Status: DC | PRN
  Administered 2018-10-24: 120 mg via INTRAVENOUS

## 2018-10-24 MED ORDER — PHENYLEPHRINE HCL (PRESSORS) 10 MG/ML IV SOLN
INTRAVENOUS | Status: DC | PRN
  Administered 2018-10-24: 80 ug via INTRAVENOUS

## 2018-10-24 MED ORDER — EPHEDRINE 5 MG/ML INJ
INTRAVENOUS | Status: AC
  Filled 2018-10-24: qty 10

## 2018-10-24 MED ORDER — DEXAMETHASONE SODIUM PHOSPHATE 10 MG/ML IJ SOLN
INTRAMUSCULAR | Status: AC
  Filled 2018-10-24: qty 1

## 2018-10-24 SURGICAL SUPPLY — 27 items
CANISTER SUCT 3000ML PPV (MISCELLANEOUS) ×1 IMPLANT
CATH ROBINSON RED A/P 16FR (CATHETERS) ×3 IMPLANT
CLOTH BEACON ORANGE TIMEOUT ST (SAFETY) ×3 IMPLANT
COUNTER NEEDLE 1200 MAGNETIC (NEEDLE) ×3 IMPLANT
COVER WAND RF STERILE (DRAPES) ×3 IMPLANT
CURETTE PIPELLE ENDOMTRL SUCTN (MISCELLANEOUS) IMPLANT
DEVICE MYOSURE LITE (MISCELLANEOUS) ×2 IMPLANT
DEVICE MYOSURE REACH (MISCELLANEOUS) IMPLANT
DILATOR CANAL MILEX (MISCELLANEOUS) IMPLANT
GAUZE 4X4 16PLY RFD (DISPOSABLE) ×3 IMPLANT
GLOVE BIO SURGEON STRL SZ7 (GLOVE) ×3 IMPLANT
GLOVE BIOGEL PI IND STRL 7.0 (GLOVE) ×1 IMPLANT
GLOVE BIOGEL PI INDICATOR 7.0 (GLOVE) ×2
GOWN STRL REUS W/TWL LRG LVL3 (GOWN DISPOSABLE) ×6 IMPLANT
IV NS IRRIG 3000ML ARTHROMATIC (IV SOLUTION) ×4 IMPLANT
KIT PROCEDURE FLUENT (KITS) ×3 IMPLANT
KIT TURNOVER CYSTO (KITS) ×3 IMPLANT
MYOSURE XL FIBROID (MISCELLANEOUS)
PACK VAGINAL MINOR WOMEN LF (CUSTOM PROCEDURE TRAY) ×3 IMPLANT
PAD OB MATERNITY 4.3X12.25 (PERSONAL CARE ITEMS) ×3 IMPLANT
PAD PREP 24X48 CUFFED NSTRL (MISCELLANEOUS) ×3 IMPLANT
PIPELLE ENDOMETRIAL SUCTION CU (MISCELLANEOUS) ×3
SEAL CERVICAL OMNI LOK (ABLATOR) IMPLANT
SEAL ROD LENS SCOPE MYOSURE (ABLATOR) ×3 IMPLANT
SYSTEM TISS REMOVAL MYOSURE XL (MISCELLANEOUS) IMPLANT
TOWEL OR 17X26 10 PK STRL BLUE (TOWEL DISPOSABLE) ×4 IMPLANT
WATER STERILE IRR 500ML POUR (IV SOLUTION) ×1 IMPLANT

## 2018-10-24 NOTE — Progress Notes (Signed)
The patient was re-examined with no change in status 

## 2018-10-24 NOTE — Anesthesia Procedure Notes (Signed)
Performed by: Flynn-Cook, Kenetha Cozza A, CRNA       

## 2018-10-24 NOTE — Discharge Instructions (Signed)

## 2018-10-24 NOTE — Transfer of Care (Signed)
Immediate Anesthesia Transfer of Care Note  Patient: Jenna Thomas  Procedure(s) Performed: DILATATION & CURETTAGE/HYSTEROSCOPY WITH MYOSURE (N/A Uterus)  Patient Location: PACU  Anesthesia Type:General  Level of Consciousness: awake, alert , oriented and patient cooperative  Airway & Oxygen Therapy: Patient Spontanous Breathing and Patient connected to nasal cannula oxygen  Post-op Assessment: Report given to RN and Post -op Vital signs reviewed and stable  Post vital signs: Reviewed and stable  Last Vitals:  Vitals Value Taken Time  BP 144/72 10/24/18 0819  Temp 36.4 C 10/24/18 0819  Pulse 68 10/24/18 0821  Resp 17 10/24/18 0821  SpO2 100 % 10/24/18 0821  Vitals shown include unvalidated device data.  Last Pain:  Vitals:   10/24/18 0601  TempSrc:   PainSc: 0-No pain      Patients Stated Pain Goal: 7 (97/35/32 9924)  Complications: No apparent anesthesia complications

## 2018-10-24 NOTE — Op Note (Signed)
Preoperative diagnosis: Endometrial polyp  Postoperative diagnosis: Same  Procedure: D&C, hysteroscopy, MyoSure resection of endometrial polyp  EBL: Less than 10 cc  Surgeon: Matthew Saras  Anesthesia: General  Procedure and findings:  Patient was taken to the operating room after an adequate level of general anesthesia was obtained with the patient's legs in stirrups the perineum and vagina were prepped and draped the bladder was drained, EUA was carried out the uterus was small midposition adnexa negative.  Appropriate timeouts were taken.  Speculum was positioned cervix grasped with a tenaculum was sounded to 6 cm progressively dilated fairly easily to 27 Pratt dilator.  Initially the smaller scope was inserted well-defined polyp was noted on the upper fundal wall.  The MyoSure resectoscope was then inserted additional dilatation was not required.  This was resected easily to the level of the surrounding tissue.  Photos were taken.  No additional abnormalities were noted after the polyp was resected scope was removed she tolerated this well went to recovery room in good condition.  Dictated with Dragon Medical 1  Margarette Asal MD

## 2018-10-24 NOTE — Anesthesia Procedure Notes (Signed)
Date/Time: 10/24/2018 7:43 AM Performed by: Garrel Ridgel, CRNA Pre-anesthesia Checklist: Patient identified, Emergency Drugs available, Suction available and Patient being monitored Patient Re-evaluated:Patient Re-evaluated prior to induction Oxygen Delivery Method: Circle system utilized Preoxygenation: Pre-oxygenation with 100% oxygen Induction Type: IV induction Ventilation: Mask ventilation without difficulty LMA: LMA inserted LMA Size: 3.0 Number of attempts: 1 Placement Confirmation: positive ETCO2 and breath sounds checked- equal and bilateral Tube secured with: Tape Dental Injury: Teeth and Oropharynx as per pre-operative assessment

## 2018-10-24 NOTE — Anesthesia Postprocedure Evaluation (Signed)
Anesthesia Post Note  Patient: Jenna Thomas  Procedure(s) Performed: DILATATION & CURETTAGE/HYSTEROSCOPY WITH MYOSURE (N/A Uterus)     Patient location during evaluation: Phase II Anesthesia Type: General Level of consciousness: awake and alert, oriented and patient cooperative Pain management: pain level controlled Vital Signs Assessment: post-procedure vital signs reviewed and stable Respiratory status: spontaneous breathing, nonlabored ventilation and respiratory function stable Cardiovascular status: blood pressure returned to baseline and stable Postop Assessment: no apparent nausea or vomiting, adequate PO intake and able to ambulate Anesthetic complications: no    Last Vitals:  Vitals:   10/24/18 0845 10/24/18 0913  BP: (!) 141/57 (!) 160/73  Pulse: 63 69  Resp: 16 20  Temp:  36.5 C  SpO2: 100% 100%    Last Pain:  Vitals:   10/24/18 0913  TempSrc:   PainSc: 0-No pain                 Alandra Sando,E. Loraine Freid

## 2018-10-25 ENCOUNTER — Encounter (HOSPITAL_BASED_OUTPATIENT_CLINIC_OR_DEPARTMENT_OTHER): Payer: Self-pay | Admitting: Obstetrics and Gynecology

## 2018-11-01 ENCOUNTER — Other Ambulatory Visit: Payer: Self-pay | Admitting: Internal Medicine

## 2018-11-01 DIAGNOSIS — Z1231 Encounter for screening mammogram for malignant neoplasm of breast: Secondary | ICD-10-CM

## 2018-12-04 ENCOUNTER — Telehealth: Payer: Self-pay | Admitting: Internal Medicine

## 2018-12-04 ENCOUNTER — Encounter: Payer: Self-pay | Admitting: Internal Medicine

## 2018-12-04 NOTE — Telephone Encounter (Signed)
Hey Dr. Carlean Purl- This patient is a previous Dr. Olevia Perches patient and  is being referred to [place] by Dr. Nyoka Cowden of internal medicine and I spoke with his nurse and she is requesting that you be the doctor. Will you take this patient on?

## 2018-12-05 NOTE — Telephone Encounter (Signed)
Yes I will

## 2018-12-12 ENCOUNTER — Ambulatory Visit: Payer: Medicare Other | Admitting: Gastroenterology

## 2018-12-12 ENCOUNTER — Encounter: Payer: Self-pay | Admitting: Gastroenterology

## 2018-12-12 ENCOUNTER — Ambulatory Visit (INDEPENDENT_AMBULATORY_CARE_PROVIDER_SITE_OTHER): Payer: Medicare Other | Admitting: Gastroenterology

## 2018-12-12 ENCOUNTER — Encounter

## 2018-12-12 VITALS — BP 130/72 | HR 72 | Temp 98.4°F | Ht 61.0 in | Wt 102.0 lb

## 2018-12-12 DIAGNOSIS — K629 Disease of anus and rectum, unspecified: Secondary | ICD-10-CM

## 2018-12-12 DIAGNOSIS — K5909 Other constipation: Secondary | ICD-10-CM

## 2018-12-12 NOTE — Progress Notes (Signed)
[facility] Consult Note:  History: Jenna Thomas 12/12/2018  Referring provider: Levin Erp, MD  Reason for consult/chief complaint: r/o rectal mass (PCP sent her for eval, patient having no pain or bleeding)   Subjective  HPI: Notes from her primary care provider indicate that during the most recent routine health maintenance exam, a rectal exam was done with a reported palpable abnormality. Pelvic ultrasound was done suggesting 5 mm abnormality of the endometrium, for which she underwent D&C on August 20.  Pathology showed a benign endometrial polyp. CT scan was done as well, report below.  She tends toward constipation with some difficulty passing a bowel movement although the stool is soft.  She denies chronic abdominal pain denies rectal bleeding.    ROS:  Review of Systems Denies chest pain dyspnea or dysuria. Urinary frequency Arthritic hip pain  Past Medical History: Past Medical History:  Diagnosis Date   Arthritis    back   Endometrial polyp    Hypertension    Hypothyroidism    Macular degeneration of both eyes    Osteopenia    PMB (postmenopausal bleeding)    Wears glasses      Past Surgical History: Past Surgical History:  Procedure Laterality Date   CATARACT EXTRACTION W/ INTRAOCULAR LENS  IMPLANT, BILATERAL  2012   COLONOSCOPY  last one 2014   [place] N/A 10/24/2018   Procedure: [place];  Surgeon: Molli Posey, MD;  Location: [facility];  Service: Gynecology;  Laterality: N/A;   TOTAL HIP ARTHROPLASTY Bilateral left 12-18-2005;  right 07-04-2009  dr Alvan Dame @[facility]      Family History: Family History  Problem Relation Age of Onset   Heart disease Mother    Heart disease Father    Bladder Cancer Brother        former smoker   Colon cancer Neg Hx    Esophageal cancer Neg Hx    Rectal cancer Neg Hx     Stomach cancer Neg Hx    Breast cancer Neg Hx     Social History: Social History   Socioeconomic History   Marital status: Married    Spouse name: Not on file   Number of children: 4   Years of education: Not on file   Highest education level: Not on file  Occupational History   Occupation: retired  Scientist, product/process development strain: Not on file   Food insecurity    Worry: Not on file    Inability: Not on file   Transportation needs    Medical: Not on file    Non-medical: Not on file  Tobacco Use   Smoking status: Former Smoker    Years: 40.00    Types: Cigarettes    Quit date: 03/07/1995    Years since quitting: 23.7   Smokeless tobacco: Never Used  Substance and Sexual Activity   Alcohol use: Yes    Alcohol/week: 3.0 standard drinks    Types: 3 Glasses of wine per week    Comment: one glass of wine 4 times a week   Drug use: Never   Sexual activity: Not on file  Lifestyle   Physical activity    Days per week: Not on file    Minutes per session: Not on file   Stress: Not on file  Relationships   Social connections    Talks on phone: Not on file    Gets together: Not on file  Attends religious service: Not on file    Active member of club or organization: Not on file    Attends meetings of clubs or organizations: Not on file    Relationship status: Not on file  Other Topics Concern   Not on file  Social History Narrative   Not on file    Allergies: Allergies  Allergen Reactions   Sulfa Antibiotics Other (See Comments)    Does not remember reaction    Outpatient Meds: Current Outpatient Medications  Medication Sig Dispense Refill   Calcium Carb-Cholecalciferol (CALCIUM 600 + D PO) Take 1 tablet by mouth 2 (two) times daily.     levothyroxine (SYNTHROID, LEVOTHROID) 75 MCG tablet Take 75 mcg by mouth daily before breakfast.      losartan-hydrochlorothiazide (HYZAAR) 100-25 MG per tablet Take 1 tablet by mouth daily.       Multiple Vitamins-Minerals (PRESERVISION AREDS 2) CAPS Take 1 capsule by mouth 2 (two) times daily.     spironolactone (ALDACTONE) 25 MG tablet Take 25 mg by mouth daily.     No current facility-administered medications for this visit.       ___________________________________________________________________ Objective   Exam:  BP 130/72    Pulse 72    Temp 98.4 F (36.9 C)    Ht 5\' 1"  (1.549 m)    Wt 102 lb (46.3 kg)    BMI 19.27 kg/m    General: Well-appearing, seems in excellent health for her age.  Exam chaperoned by Patti Martinique, CMA  CV: RRR without murmur, S1/S2, no JVD, no peripheral edema  Resp: clear to auscultation bilaterally, normal RR and effort noted  GI: soft, no tenderness, with active bowel sounds. No guarding or palpable organomegaly noted.  Skin; warm and dry, no rash or jaundice noted  Neuro: awake, alert and oriented x 3. Normal gross motor function and fluent speech Rectal: Decreasing resting sphincter tone.  Normal perianal exam. Cervix is felt through the anterior rectal wall because of pelvic descent. Copious soft stool in the rectum.  Radiologic Studies:  CLINICAL DATA:  Patient with history of pelvic mass on physical exam.   EXAM: TRANSABDOMINAL AND TRANSVAGINAL ULTRASOUND OF PELVIS   TECHNIQUE: Both transabdominal and transvaginal ultrasound examinations of the pelvis were performed. Transabdominal technique was performed for global imaging of the pelvis including uterus, ovaries, adnexal regions, and pelvic cul-de-sac.   It was necessary to proceed with endovaginal exam following the transabdominal exam to visualize the endometrium and adnexal structures.   COMPARISON:  CT abdomen pelvis 09/21/2018   FINDINGS: Uterus   Measurements: 4.4 x 2.4 x 3.9 cm = volume: 21.9 mL. No fibroids or other mass visualized.   Endometrium   The endometrial canal is distended with fluid. Within the endometrial canal there is a 5 mm mass  with associated vascularity.   Right ovary   Not visualized.   Left ovary   Not visualized.   Other findings   No abnormal free fluid.   IMPRESSION: There is a 5 mm mass within the endometrium surrounded by a small amount of fluid. Considerations include polyp or endometrial carcinoma. Endometrial sampling is indicated to exclude carcinoma. If results are benign, sonohysterogram should be considered for focal lesion work-up prior to hysteroscopy. (Ref: Radiological Reasoning: Algorithmic Workup of Abnormal Vaginal Bleeding with Endovaginal Sonography and Sonohysterography. AJR [id number])   These results will be called to the ordering clinician or representative by the Radiologist Assistant, and communication documented in the PACS or zVision Dashboard.  Electronically Signed   By: Lovey Newcomer M.D.   On: 10/07/2018 13:31  _______________________________________________________-  CLINICAL DATA:  Questionable pelvic mass on rectal examination.   EXAM: CT ABDOMEN AND PELVIS WITH CONTRAST   TECHNIQUE: Multidetector CT imaging of the abdomen and pelvis was performed using the standard protocol following bolus administration of intravenous contrast.   CONTRAST:  74mL ISOVUE-300 IOPAMIDOL (ISOVUE-300) INJECTION 61%   COMPARISON:  None.   FINDINGS: Lower chest: The lung bases are clear of an acute process. The heart is normal in size. No pericardial effusion. Distal esophagus is grossly normal.   Hepatobiliary: No worrisome hepatic lesions or intrahepatic biliary dilatation. The gallbladder is normal. No common bile duct dilatation.   Pancreas: No mass, inflammation or ductal dilatation.   Spleen: Normal size.  No focal lesions.   Adrenals/Urinary Tract: The adrenal glands are normal.   No renal, ureteral or bladder calculi or mass. Small bilateral renal cysts are noted.   Stomach/Bowel: The stomach, duodenum, small bowel and colon are grossly  normal. No acute inflammatory changes, mass lesions or obstructive findings. The terminal ileum and appendix are normal.   There is a large amount of stool throughout the colon and down into the rectum suggesting constipation. Significant motion artifact and artifact from bilateral hip prostheses limiting evaluation of the low pelvis but no obvious mass or adenopathy.   Vascular/Lymphatic: Advanced atherosclerotic calcifications involving the aorta and iliac arteries.   No mesenteric or retroperitoneal or pelvic lymphadenopathy.   Reproductive: Grossly normal.   Other: No abdominal wall hernia.  No inguinal mass or adenopathy.   Musculoskeletal: Bilateral hip prostheses without complicating feature. No worrisome bone lesions. Severe degenerative lumbar spondylosis with severe multilevel disc disease and facet disease but no acute bony findings.   IMPRESSION: 1. Examination the pelvis is limited by motion artifact and artifact from bilateral hip prosthesis. No obvious rectal or perirectal mass is identified. 2. Large amount of stool throughout the colon and down into the rectum suggesting constipation and fecal impaction. 3. No acute abdominal findings or adenopathy. 4. Advanced atherosclerotic calcifications involving the aorta and branch vessels.     Electronically Signed   By: Marijo Sanes M.D.   On: 09/23/2018 16:41  Assessment: Encounter Diagnoses  Name Primary?   Rectal abnormality Yes   Chronic constipation     Benign finding on rectal exam related to descent of pelvic structures.  It is also causing mild constipation but I would not advocate surgery.  Plan:  See [place] as needed. She would not appear to need annual rectal exam with her physical, as she is past colon cancer screening age.  Thank you for the courtesy of this consult.  Please call me with any questions or concerns.  Nelida Meuse III  CC: Referring provider noted above

## 2018-12-12 NOTE — Patient Instructions (Signed)
If you are age 83 or older, your body mass index should be between 23-30. Your Body mass index is 19.27 kg/m. If this is out of the aforementioned range listed, please consider follow up with your Primary Care Provider.  If you are age 7 or younger, your body mass index should be between 19-25. Your Body mass index is 19.27 kg/m. If this is out of the aformentioned range listed, please consider follow up with your Primary Care Provider.   It was a pleasure to see you today!  Dr. Loletha Carrow

## 2018-12-19 ENCOUNTER — Other Ambulatory Visit: Payer: Self-pay

## 2018-12-19 ENCOUNTER — Ambulatory Visit
Admission: RE | Admit: 2018-12-19 | Discharge: 2018-12-19 | Disposition: A | Discharge status: Home / Self Care | Payer: Medicare Other | Source: Ambulatory Visit | Attending: Internal Medicine | Admitting: Internal Medicine

## 2018-12-19 DIAGNOSIS — Z1231 Encounter for screening mammogram for malignant neoplasm of breast: Secondary | ICD-10-CM

## 2018-12-30 ENCOUNTER — Ambulatory Visit: Payer: Medicare Other | Admitting: Internal Medicine

## 2019-01-14 ENCOUNTER — Ambulatory Visit: Payer: Medicare Other | Admitting: Internal Medicine

## 2019-11-05 ENCOUNTER — Other Ambulatory Visit: Payer: Self-pay | Admitting: Internal Medicine

## 2019-11-05 DIAGNOSIS — Z1231 Encounter for screening mammogram for malignant neoplasm of breast: Secondary | ICD-10-CM

## 2019-12-16 ENCOUNTER — Other Ambulatory Visit: Payer: Self-pay | Admitting: Internal Medicine

## 2019-12-16 DIAGNOSIS — M810 Age-related osteoporosis without current pathological fracture: Secondary | ICD-10-CM

## 2019-12-22 ENCOUNTER — Ambulatory Visit
Admission: RE | Admit: 2019-12-22 | Discharge: 2019-12-22 | Disposition: A | Discharge status: Home / Self Care | Payer: Medicare Other | Source: Ambulatory Visit | Attending: Internal Medicine | Admitting: Internal Medicine

## 2019-12-22 ENCOUNTER — Other Ambulatory Visit: Payer: Self-pay

## 2019-12-22 DIAGNOSIS — Z1231 Encounter for screening mammogram for malignant neoplasm of breast: Secondary | ICD-10-CM

## 2020-04-01 ENCOUNTER — Other Ambulatory Visit: Payer: Medicare Other

## 2020-04-07 ENCOUNTER — Ambulatory Visit
Admission: RE | Admit: 2020-04-07 | Discharge: 2020-04-07 | Disposition: A | Discharge status: Home / Self Care | Payer: Medicare Other | Source: Ambulatory Visit | Attending: Internal Medicine | Admitting: Internal Medicine

## 2020-04-07 ENCOUNTER — Other Ambulatory Visit: Payer: Self-pay

## 2020-04-07 DIAGNOSIS — M810 Age-related osteoporosis without current pathological fracture: Secondary | ICD-10-CM

## 2020-08-25 IMAGING — MG DIGITAL SCREENING BILAT W/ TOMO W/ CAD
8 series · 9 of 24 positions shown · non-contrast
Comparison: Previous exam(s).

CLINICAL DATA: Screening.

EXAM:
DIGITAL SCREENING BILATERAL MAMMOGRAM WITH TOMO AND CAD

[R CC synth-2D]
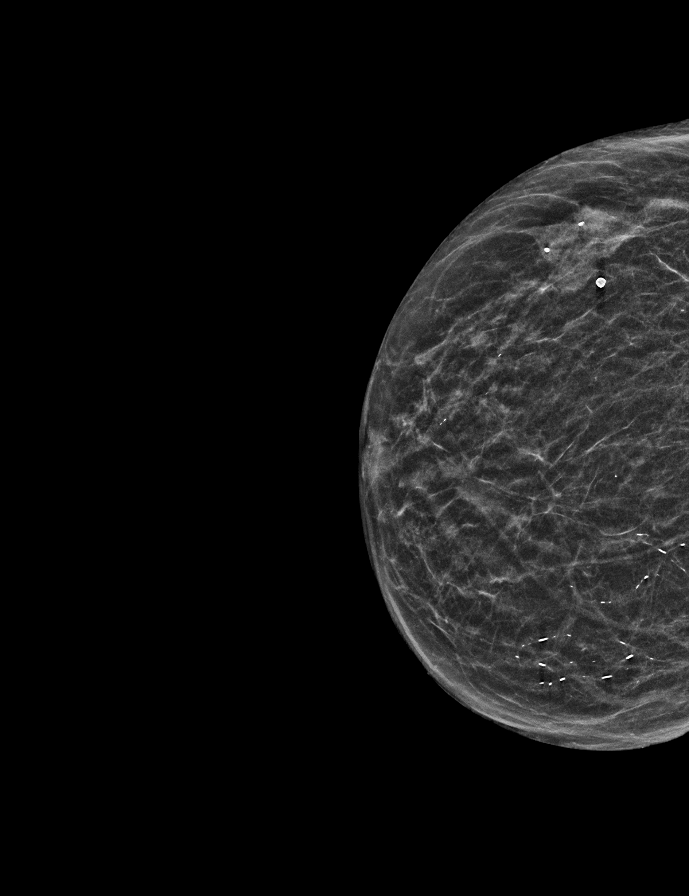

[R MLO synth-2D]
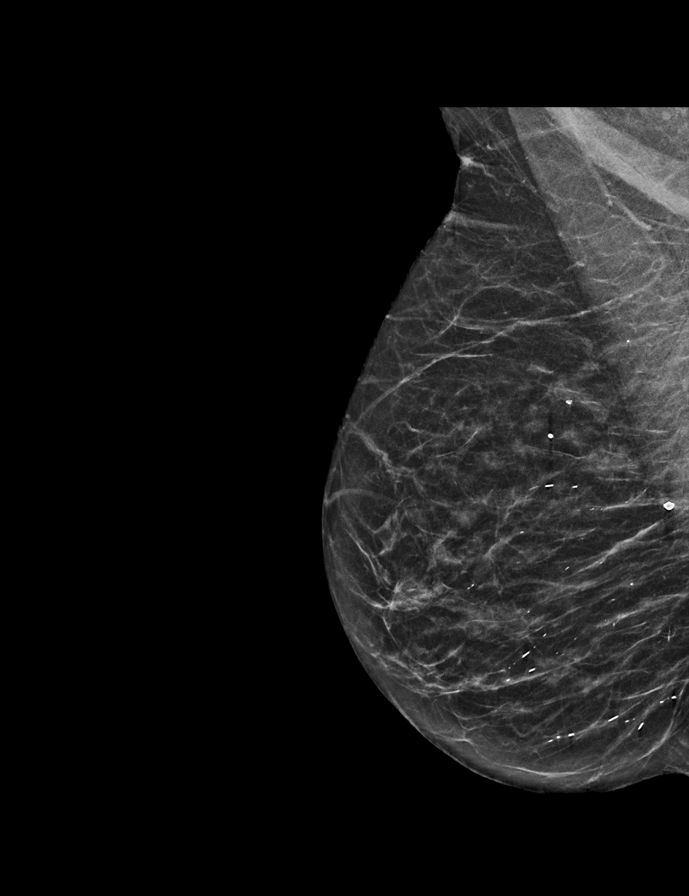

[L MLO synth-2D]
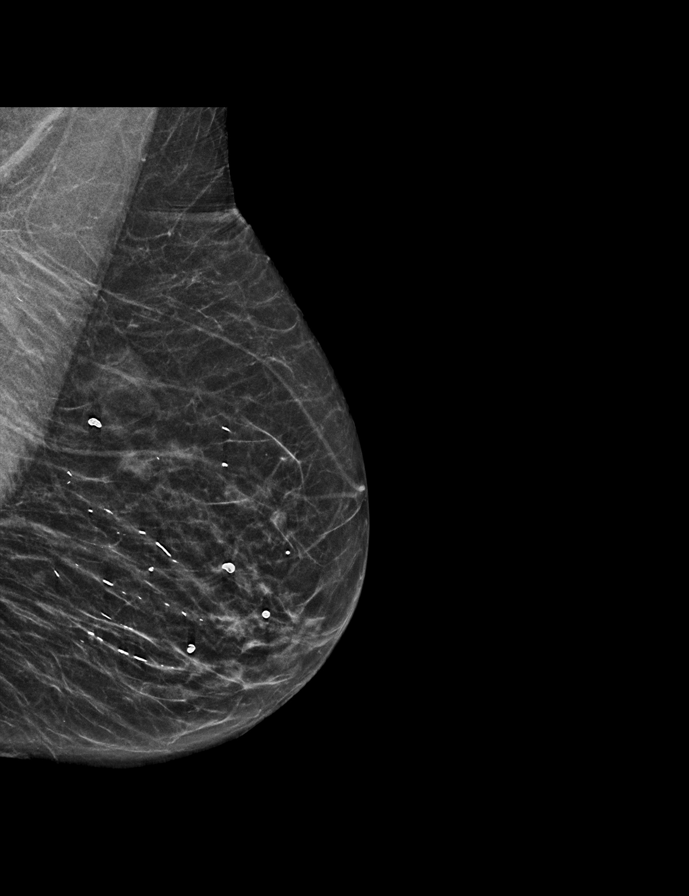

[L CC synth-2D]
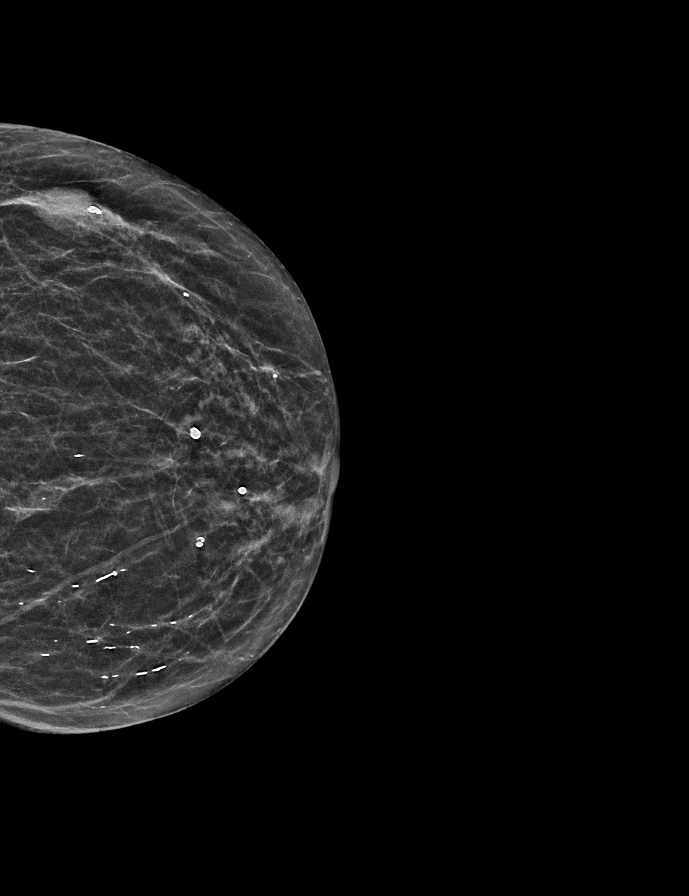

[R MLO tomo · 2 of 39 frames shown]
[frame 13/39]
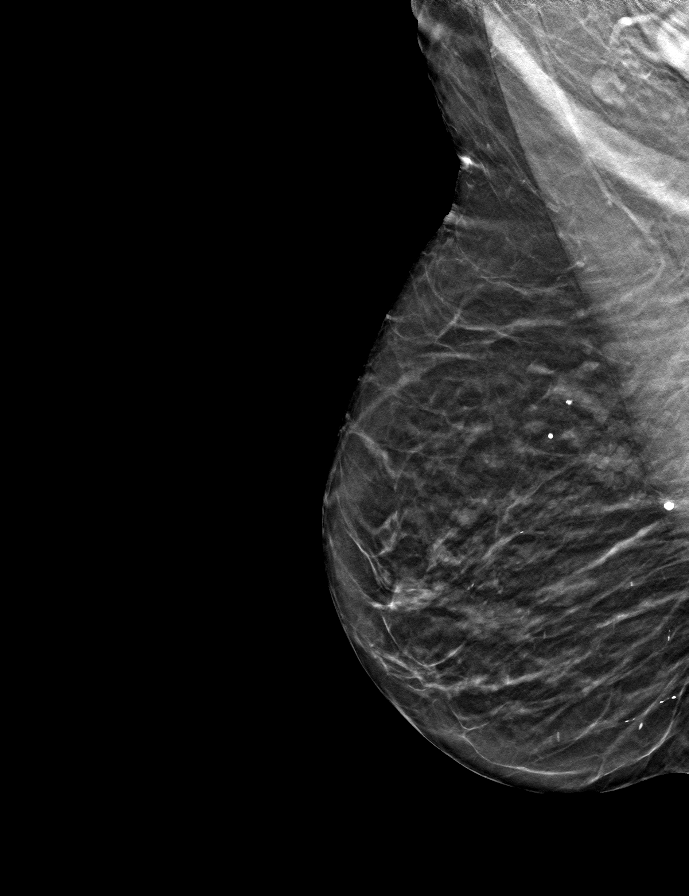
[frame 20/39]
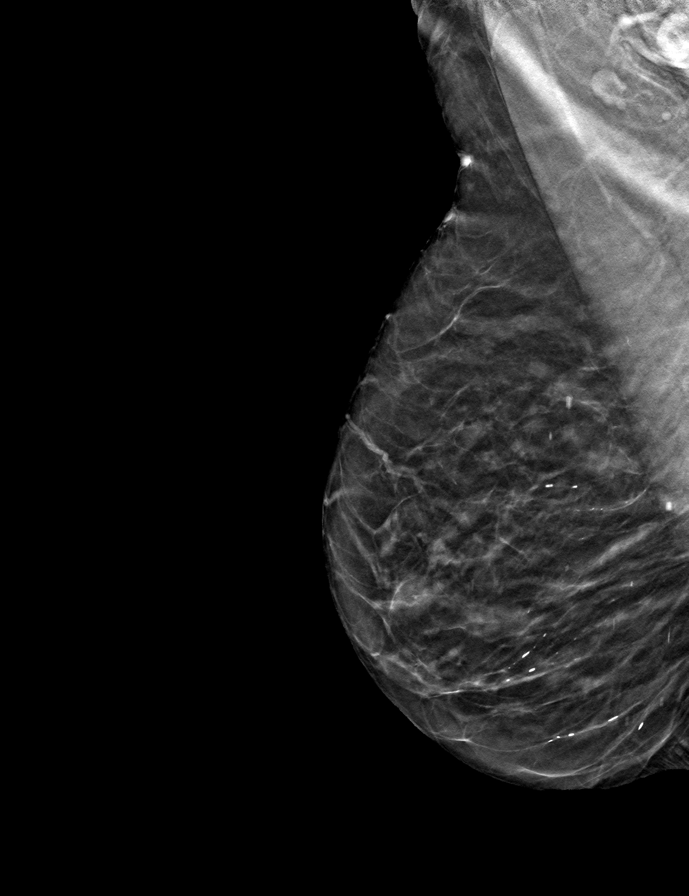

[R CC tomo · tomo slice 17/34.0]
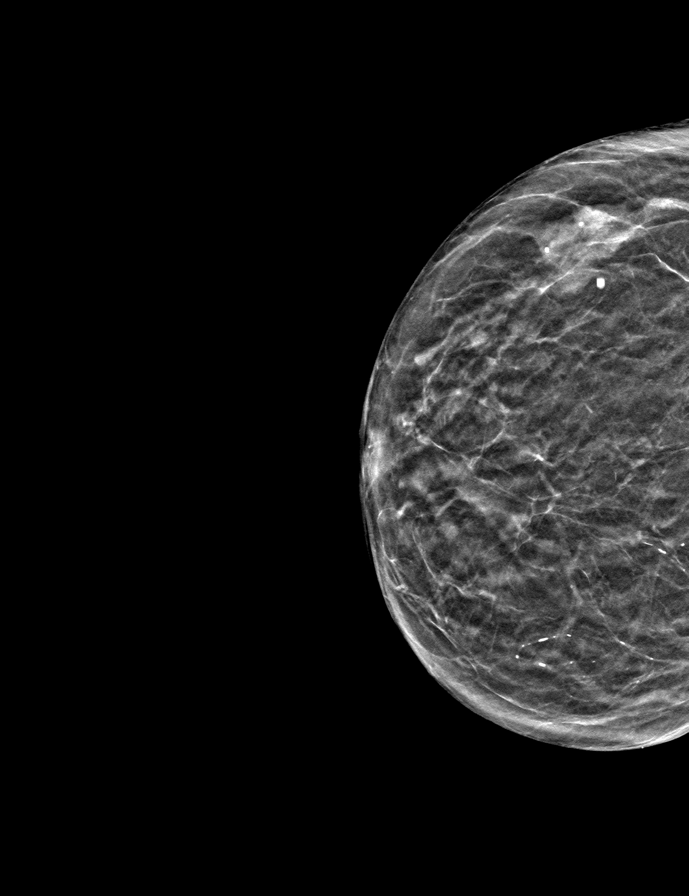

[L MLO tomo · tomo slice 20/39.0]
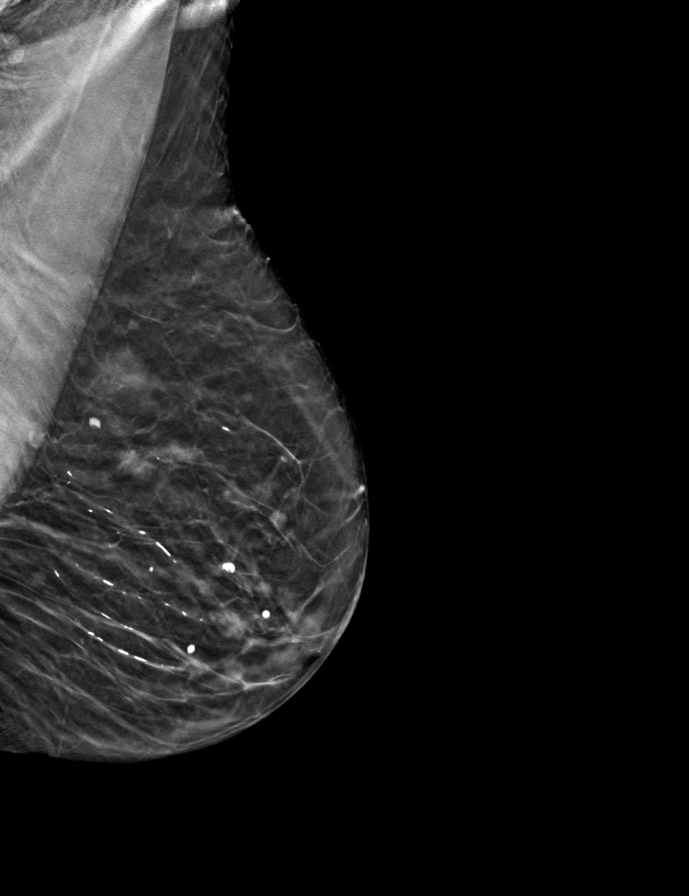

[L CC tomo · tomo slice 17/32.0]
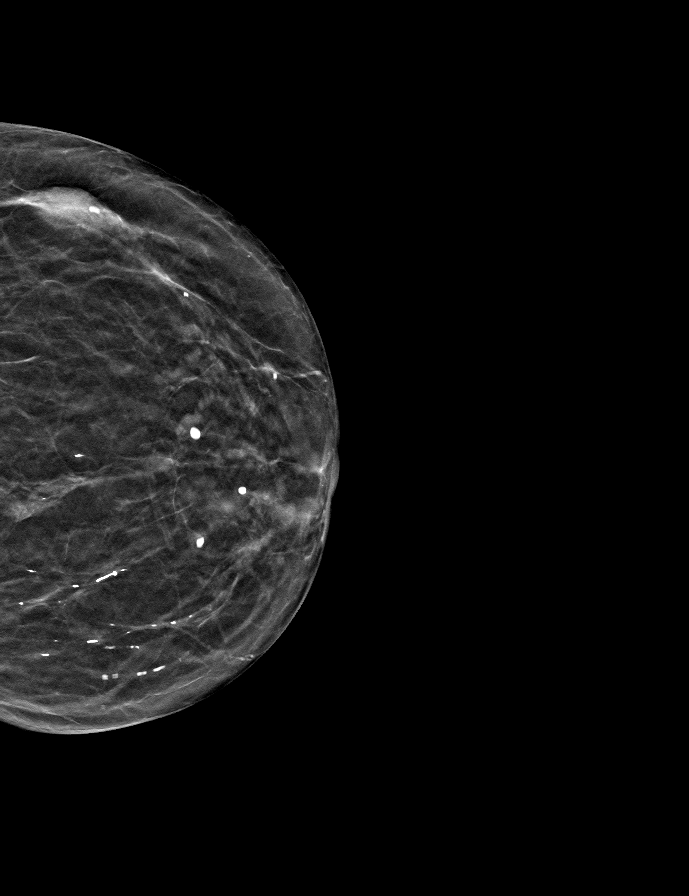

[9 of 24 positions shown; findings below may reference images not displayed]

ACR Breast Density Category c: The breast tissue is heterogeneously
dense, which may obscure small masses.
FINDINGS: There are no findings suspicious for malignancy. Images were
processed with CAD.
IMPRESSION: No mammographic evidence of malignancy. A result letter of this
screening mammogram will be mailed directly to the patient.

RECOMMENDATION:
Screening mammogram in one year. (Code:[id number])

BI-RADS CATEGORY  1: Negative.

## 2020-10-27 ENCOUNTER — Other Ambulatory Visit (HOSPITAL_COMMUNITY): Payer: Self-pay | Admitting: *Deleted

## 2020-10-28 ENCOUNTER — Other Ambulatory Visit: Payer: Self-pay

## 2020-10-28 ENCOUNTER — Ambulatory Visit (HOSPITAL_COMMUNITY)
Admission: RE | Admit: 2020-10-28 | Discharge: 2020-10-28 | Disposition: A | Discharge status: Home / Self Care | Payer: Medicare Other | Source: Ambulatory Visit | Attending: Internal Medicine | Admitting: Internal Medicine

## 2020-10-28 DIAGNOSIS — M810 Age-related osteoporosis without current pathological fracture: Secondary | ICD-10-CM | POA: Diagnosis not present

## 2020-10-28 MED ORDER — DENOSUMAB 60 MG/ML ~~LOC~~ SOSY
PREFILLED_SYRINGE | SUBCUTANEOUS | Status: AC
  Administered 2020-10-28: 60 mg via SUBCUTANEOUS
  Filled 2020-10-28: qty 1

## 2020-10-28 MED ORDER — DENOSUMAB 60 MG/ML ~~LOC~~ SOSY: 60.0000 mg | PREFILLED_SYRINGE | Freq: Once | SUBCUTANEOUS | Status: AC

## 2021-04-29 ENCOUNTER — Encounter (HOSPITAL_COMMUNITY): Payer: Medicare Other

## 2021-05-12 ENCOUNTER — Other Ambulatory Visit (HOSPITAL_COMMUNITY): Payer: Self-pay

## 2021-05-13 ENCOUNTER — Ambulatory Visit (HOSPITAL_COMMUNITY)
Admission: RE | Admit: 2021-05-13 | Discharge: 2021-05-13 | Disposition: A | Discharge status: Home / Self Care | Payer: Medicare Other | Source: Ambulatory Visit | Attending: Internal Medicine | Admitting: Internal Medicine

## 2021-05-13 DIAGNOSIS — M810 Age-related osteoporosis without current pathological fracture: Secondary | ICD-10-CM | POA: Insufficient documentation

## 2021-05-13 MED ORDER — DENOSUMAB 60 MG/ML ~~LOC~~ SOSY: 60.0000 mg | PREFILLED_SYRINGE | Freq: Once | SUBCUTANEOUS | Status: AC

## 2021-05-13 MED ORDER — DENOSUMAB 60 MG/ML ~~LOC~~ SOSY
PREFILLED_SYRINGE | SUBCUTANEOUS | Status: AC
  Administered 2021-05-13: 60 mg via SUBCUTANEOUS
  Filled 2021-05-13: qty 1

## 2021-09-06 ENCOUNTER — Encounter (HOSPITAL_COMMUNITY)
Admission: EM | Disposition: A | Discharge status: Skilled Nursing Facility | Payer: Self-pay | Source: Skilled Nursing Facility | Attending: Internal Medicine

## 2021-09-06 ENCOUNTER — Inpatient Hospital Stay (HOSPITAL_COMMUNITY): Payer: Medicare Other | Admitting: Certified Registered Nurse Anesthetist

## 2021-09-06 ENCOUNTER — Other Ambulatory Visit: Payer: Self-pay

## 2021-09-06 ENCOUNTER — Encounter (HOSPITAL_BASED_OUTPATIENT_CLINIC_OR_DEPARTMENT_OTHER): Payer: Self-pay

## 2021-09-06 ENCOUNTER — Emergency Department (HOSPITAL_BASED_OUTPATIENT_CLINIC_OR_DEPARTMENT_OTHER): Payer: Medicare Other

## 2021-09-06 ENCOUNTER — Inpatient Hospital Stay (HOSPITAL_BASED_OUTPATIENT_CLINIC_OR_DEPARTMENT_OTHER)
Admission: EM | Admit: 2021-09-06 | Discharge: 2021-09-15 | DRG: 329 | Disposition: A | Discharge status: Skilled Nursing Facility | Payer: Medicare Other | Source: Skilled Nursing Facility | Attending: Internal Medicine | Admitting: Internal Medicine

## 2021-09-06 DIAGNOSIS — I129 Hypertensive chronic kidney disease with stage 1 through stage 4 chronic kidney disease, or unspecified chronic kidney disease: Secondary | ICD-10-CM | POA: Diagnosis present

## 2021-09-06 DIAGNOSIS — N1330 Unspecified hydronephrosis: Secondary | ICD-10-CM

## 2021-09-06 DIAGNOSIS — Z8249 Family history of ischemic heart disease and other diseases of the circulatory system: Secondary | ICD-10-CM

## 2021-09-06 DIAGNOSIS — Z8052 Family history of malignant neoplasm of bladder: Secondary | ICD-10-CM

## 2021-09-06 DIAGNOSIS — K56609 Unspecified intestinal obstruction, unspecified as to partial versus complete obstruction: Secondary | ICD-10-CM | POA: Diagnosis present

## 2021-09-06 DIAGNOSIS — K5790 Diverticulosis of intestine, part unspecified, without perforation or abscess without bleeding: Secondary | ICD-10-CM | POA: Diagnosis not present

## 2021-09-06 DIAGNOSIS — R109 Unspecified abdominal pain: Secondary | ICD-10-CM | POA: Diagnosis present

## 2021-09-06 DIAGNOSIS — K562 Volvulus: Secondary | ICD-10-CM | POA: Diagnosis present

## 2021-09-06 DIAGNOSIS — N179 Acute kidney failure, unspecified: Secondary | ICD-10-CM

## 2021-09-06 DIAGNOSIS — N1831 Chronic kidney disease, stage 3a: Secondary | ICD-10-CM | POA: Diagnosis present

## 2021-09-06 DIAGNOSIS — R739 Hyperglycemia, unspecified: Secondary | ICD-10-CM | POA: Diagnosis present

## 2021-09-06 DIAGNOSIS — E039 Hypothyroidism, unspecified: Secondary | ICD-10-CM | POA: Diagnosis present

## 2021-09-06 DIAGNOSIS — Z96643 Presence of artificial hip joint, bilateral: Secondary | ICD-10-CM | POA: Diagnosis present

## 2021-09-06 DIAGNOSIS — K5730 Diverticulosis of large intestine without perforation or abscess without bleeding: Principal | ICD-10-CM | POA: Diagnosis present

## 2021-09-06 DIAGNOSIS — Z20822 Contact with and (suspected) exposure to covid-19: Secondary | ICD-10-CM | POA: Diagnosis present

## 2021-09-06 DIAGNOSIS — Z90710 Acquired absence of both cervix and uterus: Secondary | ICD-10-CM

## 2021-09-06 DIAGNOSIS — G9341 Metabolic encephalopathy: Secondary | ICD-10-CM | POA: Diagnosis not present

## 2021-09-06 DIAGNOSIS — Z882 Allergy status to sulfonamides status: Secondary | ICD-10-CM

## 2021-09-06 DIAGNOSIS — N736 Female pelvic peritoneal adhesions (postinfective): Secondary | ICD-10-CM | POA: Diagnosis present

## 2021-09-06 DIAGNOSIS — Z87891 Personal history of nicotine dependence: Secondary | ICD-10-CM

## 2021-09-06 DIAGNOSIS — Z933 Colostomy status: Secondary | ICD-10-CM

## 2021-09-06 DIAGNOSIS — Z79899 Other long term (current) drug therapy: Secondary | ICD-10-CM | POA: Diagnosis not present

## 2021-09-06 DIAGNOSIS — Z79890 Hormone replacement therapy (postmenopausal): Secondary | ICD-10-CM | POA: Diagnosis not present

## 2021-09-06 DIAGNOSIS — K5720 Diverticulitis of large intestine with perforation and abscess without bleeding: Secondary | ICD-10-CM

## 2021-09-06 DIAGNOSIS — I10 Essential (primary) hypertension: Secondary | ICD-10-CM

## 2021-09-06 DIAGNOSIS — K567 Ileus, unspecified: Secondary | ICD-10-CM | POA: Diagnosis not present

## 2021-09-06 DIAGNOSIS — H3530 Unspecified macular degeneration: Secondary | ICD-10-CM | POA: Diagnosis present

## 2021-09-06 DIAGNOSIS — K55069 Acute infarction of intestine, part and extent unspecified: Secondary | ICD-10-CM | POA: Diagnosis not present

## 2021-09-06 DIAGNOSIS — K659 Peritonitis, unspecified: Principal | ICD-10-CM

## 2021-09-06 HISTORY — PX: LAPAROTOMY: SHX154

## 2021-09-06 LAB — COMPREHENSIVE METABOLIC PANEL
ALT: 14 U/L (ref 0–44)
AST: 14 U/L — ABNORMAL LOW (ref 15–41)
Albumin: 3.4 g/dL — ABNORMAL LOW (ref 3.5–5.0)
Alkaline Phosphatase: 65 U/L (ref 38–126)
Anion gap: 15 (ref 5–15)
BUN: 75 mg/dL — ABNORMAL HIGH (ref 8–23)
CO2: 22 mmol/L (ref 22–32)
Calcium: 9.5 mg/dL (ref 8.9–10.3)
Chloride: 94 mmol/L — ABNORMAL LOW (ref 98–111)
Creatinine, Ser: 1.98 mg/dL — ABNORMAL HIGH (ref 0.44–1.00)
GFR, Estimated: 24 mL/min — ABNORMAL LOW (ref >60)
Glucose, Bld: 220 mg/dL — ABNORMAL HIGH (ref 70–99)
Potassium: 4.2 mmol/L (ref 3.5–5.1)
Sodium: 131 mmol/L — ABNORMAL LOW (ref 135–145)
Total Bilirubin: 0.5 mg/dL (ref 0.3–1.2)
Total Protein: 7 g/dL (ref 6.5–8.1)

## 2021-09-06 LAB — RESP PANEL BY RT-PCR (FLU A&B, COVID) ARPGX2
Influenza A by PCR: NEGATIVE
Influenza B by PCR: NEGATIVE
SARS Coronavirus 2 by RT PCR: NEGATIVE

## 2021-09-06 LAB — MRSA NEXT GEN BY PCR, NASAL: MRSA by PCR Next Gen: NOT DETECTED

## 2021-09-06 LAB — PROTIME-INR
INR: 1.4 — ABNORMAL HIGH (ref 0.8–1.2)
Prothrombin Time: 16.6 seconds — ABNORMAL HIGH (ref 11.4–15.2)

## 2021-09-06 LAB — LACTIC ACID, PLASMA: Lactic Acid, Venous: 1.8 mmol/L (ref 0.5–1.9)

## 2021-09-06 LAB — TSH: TSH: 8.025 u[IU]/mL — ABNORMAL HIGH (ref 0.350–4.500)

## 2021-09-06 LAB — CBC
HCT: 35.8 % — ABNORMAL LOW (ref 36.0–46.0)
Hemoglobin: 11.8 g/dL — ABNORMAL LOW (ref 12.0–15.0)
MCH: 32.6 pg (ref 26.0–34.0)
MCHC: 33 g/dL (ref 30.0–36.0)
MCV: 98.9 fL (ref 80.0–100.0)
Platelets: 331 10*3/uL (ref 150–400)
RBC: 3.62 MIL/uL — ABNORMAL LOW (ref 3.87–5.11)
RDW: 13.4 % (ref 11.5–15.5)
WBC: 23.4 10*3/uL — ABNORMAL HIGH (ref 4.0–10.5)
nRBC: 0 % (ref 0.0–0.2)

## 2021-09-06 LAB — APTT: aPTT: 28 seconds (ref 24–36)

## 2021-09-06 LAB — GLUCOSE, CAPILLARY: Glucose-Capillary: 122 mg/dL — ABNORMAL HIGH (ref 70–99)

## 2021-09-06 LAB — LIPASE, BLOOD: Lipase: 13 U/L (ref 11–51)

## 2021-09-06 LAB — HEMOGLOBIN A1C
Hgb A1c MFr Bld: 5.9 % — ABNORMAL HIGH (ref 4.8–5.6)
Mean Plasma Glucose: 122.63 mg/dL

## 2021-09-06 SURGERY — LAPAROTOMY, EXPLORATORY
Anesthesia: General | Site: Abdomen

## 2021-09-06 MED ORDER — HYDROMORPHONE HCL 1 MG/ML IJ SOLN
1.0000 mg | INTRAMUSCULAR | Status: DC | PRN
  Administered 2021-09-07 (×2): 1 mg via INTRAVENOUS
  Filled 2021-09-06 (×2): qty 1

## 2021-09-06 MED ORDER — FENTANYL CITRATE PF 50 MCG/ML IJ SOSY: 25.0000 ug | PREFILLED_SYRINGE | INTRAMUSCULAR | Status: DC | PRN

## 2021-09-06 MED ORDER — ONDANSETRON 4 MG PO TBDP: 4.0000 mg | ORAL_TABLET | Freq: Four times a day (QID) | ORAL | Status: DC | PRN

## 2021-09-06 MED ORDER — SUCCINYLCHOLINE CHLORIDE 200 MG/10ML IV SOSY
PREFILLED_SYRINGE | INTRAVENOUS | Status: AC
  Filled 2021-09-06: qty 10

## 2021-09-06 MED ORDER — ROCURONIUM BROMIDE 10 MG/ML (PF) SYRINGE
PREFILLED_SYRINGE | INTRAVENOUS | Status: AC
  Filled 2021-09-06: qty 10

## 2021-09-06 MED ORDER — DEXAMETHASONE SODIUM PHOSPHATE 10 MG/ML IJ SOLN
INTRAMUSCULAR | Status: DC | PRN
  Administered 2021-09-06: 4 mg via INTRAVENOUS

## 2021-09-06 MED ORDER — INSULIN ASPART 100 UNIT/ML IJ SOLN
0.0000 [IU] | Freq: Three times a day (TID) | INTRAMUSCULAR | Status: DC
  Administered 2021-09-06: 1 [IU] via SUBCUTANEOUS
  Administered 2021-09-07: 5 [IU] via SUBCUTANEOUS
  Administered 2021-09-07 – 2021-09-11 (×4): 1 [IU] via SUBCUTANEOUS
  Administered 2021-09-11: 2 [IU] via SUBCUTANEOUS
  Administered 2021-09-12 – 2021-09-13 (×5): 1 [IU] via SUBCUTANEOUS
  Administered 2021-09-14: 2 [IU] via SUBCUTANEOUS
  Administered 2021-09-14 – 2021-09-15 (×3): 1 [IU] via SUBCUTANEOUS

## 2021-09-06 MED ORDER — LACTATED RINGERS IV BOLUS (SEPSIS)
500.0000 mL | Freq: Once | INTRAVENOUS | Status: AC
Start: 2021-09-06 — End: 2021-09-06
  Administered 2021-09-06: 500 mL via INTRAVENOUS

## 2021-09-06 MED ORDER — CHLORHEXIDINE GLUCONATE CLOTH 2 % EX PADS
6.0000 | MEDICATED_PAD | Freq: Every day | CUTANEOUS | Status: DC
  Administered 2021-09-06 – 2021-09-10 (×4): 6 via TOPICAL

## 2021-09-06 MED ORDER — SODIUM CHLORIDE 0.9 % IV SOLN
2.0000 g | Freq: Once | INTRAVENOUS | Status: AC
  Administered 2021-09-06: 2 g via INTRAVENOUS
  Filled 2021-09-06: qty 12.5

## 2021-09-06 MED ORDER — LACTATED RINGERS IV SOLN: INTRAVENOUS | Status: DC | PRN

## 2021-09-06 MED ORDER — ONDANSETRON HCL 4 MG/2ML IJ SOLN: 4.0000 mg | Freq: Four times a day (QID) | INTRAMUSCULAR | Status: DC | PRN

## 2021-09-06 MED ORDER — SODIUM CHLORIDE 0.9 % IV SOLN: INTRAVENOUS | Status: DC | PRN

## 2021-09-06 MED ORDER — METRONIDAZOLE 500 MG/100ML IV SOLN
500.0000 mg | Freq: Once | INTRAVENOUS | Status: AC
  Administered 2021-09-06: 500 mg via INTRAVENOUS
  Filled 2021-09-06: qty 100

## 2021-09-06 MED ORDER — HYDROMORPHONE HCL 1 MG/ML IJ SOLN: 0.5000 mg | INTRAMUSCULAR | Status: DC | PRN

## 2021-09-06 MED ORDER — ROCURONIUM BROMIDE 10 MG/ML (PF) SYRINGE
PREFILLED_SYRINGE | INTRAVENOUS | Status: DC | PRN
  Administered 2021-09-06: 10 mg via INTRAVENOUS
  Administered 2021-09-06: 40 mg via INTRAVENOUS

## 2021-09-06 MED ORDER — ONDANSETRON HCL 4 MG PO TABS
4.0000 mg | ORAL_TABLET | Freq: Four times a day (QID) | ORAL | Status: DC | PRN
Start: 2021-09-06 — End: 2021-09-06

## 2021-09-06 MED ORDER — SUGAMMADEX SODIUM 200 MG/2ML IV SOLN
INTRAVENOUS | Status: DC | PRN
  Administered 2021-09-06: 200 mg via INTRAVENOUS

## 2021-09-06 MED ORDER — ONDANSETRON HCL 4 MG/2ML IJ SOLN
INTRAMUSCULAR | Status: AC
  Filled 2021-09-06: qty 2

## 2021-09-06 MED ORDER — ORAL CARE MOUTH RINSE: 15.0000 mL | OROMUCOSAL | Status: DC | PRN

## 2021-09-06 MED ORDER — PIPERACILLIN-TAZOBACTAM IN DEX 2-0.25 GM/50ML IV SOLN
2.2500 g | Freq: Three times a day (TID) | INTRAVENOUS | Status: DC
  Administered 2021-09-07 – 2021-09-08 (×5): 2.25 g via INTRAVENOUS
  Filled 2021-09-06 (×6): qty 50

## 2021-09-06 MED ORDER — PHENYLEPHRINE HCL-NACL 20-0.9 MG/250ML-% IV SOLN
INTRAVENOUS | Status: DC | PRN
  Administered 2021-09-06: 20 ug/min via INTRAVENOUS

## 2021-09-06 MED ORDER — HEPARIN SODIUM (PORCINE) 5000 UNIT/ML IJ SOLN
5000.0000 [IU] | Freq: Two times a day (BID) | INTRAMUSCULAR | Status: DC
  Administered 2021-09-07 – 2021-09-15 (×17): 5000 [IU] via SUBCUTANEOUS
  Filled 2021-09-06 (×17): qty 1

## 2021-09-06 MED ORDER — ACETAMINOPHEN 325 MG PO TABS
650.0000 mg | ORAL_TABLET | Freq: Four times a day (QID) | ORAL | Status: DC | PRN
  Administered 2021-09-10 – 2021-09-15 (×6): 650 mg via ORAL
  Filled 2021-09-06 (×7): qty 2

## 2021-09-06 MED ORDER — FENTANYL CITRATE (PF) 100 MCG/2ML IJ SOLN
INTRAMUSCULAR | Status: AC
  Filled 2021-09-06: qty 2

## 2021-09-06 MED ORDER — SUCCINYLCHOLINE CHLORIDE 200 MG/10ML IV SOSY
PREFILLED_SYRINGE | INTRAVENOUS | Status: DC | PRN
  Administered 2021-09-06: 100 mg via INTRAVENOUS

## 2021-09-06 MED ORDER — PHENYLEPHRINE 80 MCG/ML (10ML) SYRINGE FOR IV PUSH (FOR BLOOD PRESSURE SUPPORT)
PREFILLED_SYRINGE | INTRAVENOUS | Status: DC | PRN
  Administered 2021-09-06 (×2): 80 ug via INTRAVENOUS

## 2021-09-06 MED ORDER — ACETAMINOPHEN 10 MG/ML IV SOLN
INTRAVENOUS | Status: DC | PRN
  Administered 2021-09-06: 1000 mg via INTRAVENOUS

## 2021-09-06 MED ORDER — KCL IN DEXTROSE-NACL 20-5-0.45 MEQ/L-%-% IV SOLN
INTRAVENOUS | Status: DC
  Filled 2021-09-06: qty 1000

## 2021-09-06 MED ORDER — ACETAMINOPHEN 650 MG RE SUPP: 650.0000 mg | Freq: Four times a day (QID) | RECTAL | Status: DC | PRN

## 2021-09-06 MED ORDER — LIDOCAINE HCL (PF) 2 % IJ SOLN
INTRAMUSCULAR | Status: AC
  Filled 2021-09-06: qty 5

## 2021-09-06 MED ORDER — LACTATED RINGERS IV SOLN: INTRAVENOUS | Status: AC

## 2021-09-06 MED ORDER — PROPOFOL 10 MG/ML IV BOLUS
INTRAVENOUS | Status: DC | PRN
  Administered 2021-09-06: 80 mg via INTRAVENOUS

## 2021-09-06 MED ORDER — PROPOFOL 10 MG/ML IV BOLUS
INTRAVENOUS | Status: AC
  Filled 2021-09-06: qty 20

## 2021-09-06 MED ORDER — FENTANYL CITRATE (PF) 100 MCG/2ML IJ SOLN
INTRAMUSCULAR | Status: DC | PRN
Start: 2021-09-06 — End: 2021-09-06
  Administered 2021-09-06 (×2): 25 ug via INTRAVENOUS
  Administered 2021-09-06: 50 ug via INTRAVENOUS

## 2021-09-06 MED ORDER — LEVOTHYROXINE SODIUM 50 MCG PO TABS
75.0000 ug | ORAL_TABLET | Freq: Every day | ORAL | Status: DC
  Administered 2021-09-09 – 2021-09-15 (×7): 75 ug via ORAL
  Filled 2021-09-06 (×7): qty 1

## 2021-09-06 MED ORDER — LIDOCAINE 2% (20 MG/ML) 5 ML SYRINGE
INTRAMUSCULAR | Status: DC | PRN
  Administered 2021-09-06: 50 mg via INTRAVENOUS

## 2021-09-06 MED ORDER — ONDANSETRON HCL 4 MG/2ML IJ SOLN
INTRAMUSCULAR | Status: DC | PRN
  Administered 2021-09-06: 4 mg via INTRAVENOUS

## 2021-09-06 MED ORDER — 0.9 % SODIUM CHLORIDE (POUR BTL) OPTIME
TOPICAL | Status: DC | PRN
  Administered 2021-09-06: 3000 mL

## 2021-09-06 MED ORDER — LACTATED RINGERS IV BOLUS (SEPSIS)
1000.0000 mL | Freq: Once | INTRAVENOUS | Status: AC
Start: 2021-09-06 — End: 2021-09-06
  Administered 2021-09-06: 1000 mL via INTRAVENOUS

## 2021-09-06 MED ORDER — ORAL CARE MOUTH RINSE
15.0000 mL | OROMUCOSAL | Status: DC
  Administered 2021-09-07 – 2021-09-15 (×23): 15 mL via OROMUCOSAL

## 2021-09-06 MED ORDER — PHENYLEPHRINE HCL (PRESSORS) 10 MG/ML IV SOLN
INTRAVENOUS | Status: AC
  Filled 2021-09-06: qty 1

## 2021-09-06 MED ORDER — ONDANSETRON HCL 4 MG/2ML IJ SOLN
4.0000 mg | Freq: Four times a day (QID) | INTRAMUSCULAR | Status: DC | PRN
  Administered 2021-09-09 – 2021-09-14 (×3): 4 mg via INTRAVENOUS
  Filled 2021-09-06 (×3): qty 2

## 2021-09-06 MED ORDER — PANTOPRAZOLE SODIUM 40 MG IV SOLR
40.0000 mg | Freq: Every day | INTRAVENOUS | Status: DC
  Administered 2021-09-07 – 2021-09-14 (×9): 40 mg via INTRAVENOUS
  Filled 2021-09-06 (×9): qty 10

## 2021-09-06 MED ORDER — HYDRALAZINE HCL 20 MG/ML IJ SOLN: 5.0000 mg | Freq: Four times a day (QID) | INTRAMUSCULAR | Status: DC | PRN

## 2021-09-06 MED ORDER — DEXAMETHASONE SODIUM PHOSPHATE 10 MG/ML IJ SOLN
INTRAMUSCULAR | Status: AC
  Filled 2021-09-06: qty 1

## 2021-09-06 MED ORDER — SODIUM CHLORIDE 0.9 % IV SOLN: 2.0000 g | INTRAVENOUS | Status: DC

## 2021-09-06 MED ORDER — ACETAMINOPHEN 10 MG/ML IV SOLN
INTRAVENOUS | Status: AC
  Filled 2021-09-06: qty 100

## 2021-09-06 SURGICAL SUPPLY — 49 items
APL PRP STRL LF DISP 70% ISPRP (MISCELLANEOUS) ×1
APL SWBSTK 6 STRL LF DISP (MISCELLANEOUS) ×1
APPLICATOR COTTON TIP 6 STRL (MISCELLANEOUS) ×1 IMPLANT
APPLICATOR COTTON TIP 6IN STRL (MISCELLANEOUS) ×2 IMPLANT
BAG COUNTER SPONGE SURGICOUNT (BAG) IMPLANT
BAG SPNG CNTER NS LX DISP (BAG)
BLADE EXTENDED COATED 6.5IN (ELECTRODE) IMPLANT
BLADE HEX COATED 2.75 (ELECTRODE) ×2 IMPLANT
CHLORAPREP W/TINT 26 (MISCELLANEOUS) ×2 IMPLANT
COVER MAYO STAND STRL (DRAPES) IMPLANT
COVER SURGICAL LIGHT HANDLE (MISCELLANEOUS) ×2 IMPLANT
DRAIN CHANNEL 19F RND (DRAIN) ×1 IMPLANT
DRAPE LAPAROSCOPIC ABDOMINAL (DRAPES) ×2 IMPLANT
DRAPE WARM FLUID 44X44 (DRAPES) IMPLANT
DRSG OPSITE POSTOP 4X8 (GAUZE/BANDAGES/DRESSINGS) ×1 IMPLANT
ELECT REM PT RETURN 15FT ADLT (MISCELLANEOUS) ×2 IMPLANT
EVACUATOR SILICONE 100CC (DRAIN) ×1 IMPLANT
GAUZE SPONGE 4X4 12PLY STRL (GAUZE/BANDAGES/DRESSINGS) ×2 IMPLANT
GLOVE BIOGEL PI IND STRL 7.0 (GLOVE) ×1 IMPLANT
GLOVE BIOGEL PI INDICATOR 7.0 (GLOVE) ×1
GLOVE SURG ORTHO 8.0 STRL STRW (GLOVE) ×2 IMPLANT
GLOVE SURG SYN 7.5  E (GLOVE) ×2
GLOVE SURG SYN 7.5 E (GLOVE) ×1 IMPLANT
GLOVE SURG SYN 7.5 PF PI (GLOVE) ×1 IMPLANT
GOWN STRL REUS W/ TWL XL LVL3 (GOWN DISPOSABLE) ×2 IMPLANT
GOWN STRL REUS W/TWL XL LVL3 (GOWN DISPOSABLE) ×4
HANDLE SUCTION POOLE (INSTRUMENTS) IMPLANT
KIT BASIN OR (CUSTOM PROCEDURE TRAY) ×2 IMPLANT
KIT TURNOVER KIT A (KITS) IMPLANT
LIGASURE IMPACT 36 18CM CVD LR (INSTRUMENTS) ×1 IMPLANT
NS IRRIG 1000ML POUR BTL (IV SOLUTION) ×2 IMPLANT
PACK GENERAL/GYN (CUSTOM PROCEDURE TRAY) ×2 IMPLANT
STAPLER GUN LINEAR PROX 60 (STAPLE) ×1 IMPLANT
STAPLER PROXIMATE 75MM BLUE (STAPLE) ×1 IMPLANT
STAPLER VISISTAT 35W (STAPLE) ×2 IMPLANT
SUCTION POOLE HANDLE (INSTRUMENTS)
SUT NOV 1 T60/GS (SUTURE) IMPLANT
SUT SILK 2 0 (SUTURE)
SUT SILK 2 0 SH CR/8 (SUTURE) IMPLANT
SUT SILK 2-0 18XBRD TIE 12 (SUTURE) IMPLANT
SUT SILK 3 0 (SUTURE) ×4
SUT SILK 3 0 SH CR/8 (SUTURE) ×2 IMPLANT
SUT SILK 3-0 18XBRD TIE 12 (SUTURE) IMPLANT
SUT VICRYL 2 0 18  UND BR (SUTURE)
SUT VICRYL 2 0 18 UND BR (SUTURE) IMPLANT
TOWEL OR 17X26 10 PK STRL BLUE (TOWEL DISPOSABLE) ×4 IMPLANT
TRAY FOLEY MTR SLVR 16FR STAT (SET/KITS/TRAYS/PACK) IMPLANT
WATER STERILE IRR 1000ML POUR (IV SOLUTION) ×2 IMPLANT
YANKAUER SUCT BULB TIP NO VENT (SUCTIONS) IMPLANT

## 2021-09-06 NOTE — Anesthesia Preprocedure Evaluation (Addendum)
Anesthesia Evaluation  Patient identified by MRN, date of birth, ID band Patient awake    Reviewed: Allergy & Precautions, NPO status , Patient's Chart, lab work & pertinent test results  Airway Mallampati: II  TM Distance: >3 FB Neck ROM: Full    Dental  (+) Teeth Intact, Dental Advisory Given   Pulmonary former smoker,    breath sounds clear to auscultation       Cardiovascular hypertension, Pt. on medications  Rhythm:Regular Rate:Normal     Neuro/Psych negative neurological ROS  negative psych ROS   GI/Hepatic negative GI ROS,   Endo/Other  Hypothyroidism   Renal/GU Renal disease     Musculoskeletal  (+) Arthritis ,   Abdominal Normal abdominal exam  (+)   Peds  Hematology   Anesthesia Other Findings   Reproductive/Obstetrics                            Anesthesia Physical Anesthesia Plan  ASA: 2 and emergent  Anesthesia Plan: General   Post-op Pain Management:    Induction: Intravenous, Rapid sequence and Cricoid pressure planned  PONV Risk Score and Plan: 4 or greater and Ondansetron and Treatment may vary due to age or medical condition  Airway Management Planned: Oral ETT  Additional Equipment: None  Intra-op Plan:   Post-operative Plan: Extubation in OR  Informed Consent: I have reviewed the patients History and Physical, chart, labs and discussed the procedure including the risks, benefits and alternatives for the proposed anesthesia with the patient or authorized representative who has indicated his/her understanding and acceptance.     Dental advisory given  Plan Discussed with: CRNA  Anesthesia Plan Comments:        Anesthesia Quick Evaluation

## 2021-09-06 NOTE — Progress Notes (Signed)
Pharmacy Antibiotic Note  Jenna Thomas is a 88 y.o. female admitted on 09/06/2021 with  intra-abdominal infection .  Pharmacy has been consulted for Cefepime dosing.  WBC 23.4, Tmax 99.2 SCr 1.98 (unclear baseline SCr)  Plan: Cefepime 2g IV q24h Metronidazole per primary provider Monitor WBC, temp, Scr and clinical s/sx of infection F/u cultures and de-escalate antibiotics as appropriate   Height: '5\' 1"'$  (154.9 cm) Weight: 48.1 kg (106 lb) IBW/kg (Calculated) : 47.8  Temp (24hrs), Avg:98.9 F (37.2 C), Min:98.6 F (37 C), Max:99.2 F (37.3 C)  Recent Labs  Lab 09/06/21 1025 09/06/21 1130  WBC 23.4*  --   CREATININE 1.98*  --   LATICACIDVEN  --  1.8    Estimated Creatinine Clearance: 14.8 mL/min (A) (by C-G formula based on SCr of 1.98 mg/dL (H)).    Allergies  Allergen Reactions   Sulfa Antibiotics Other (See Comments)    Does not remember reaction    Antimicrobials this admission: Cefepime 7/4 >>  Metronidazole 7/4 >>  Dose adjustments this admission: N/A  Microbiology results: 7/4 BCx: pending 7/4 UCx: pending   Thank you for allowing pharmacy to be a part of this patient's care.  Kaleen Mask 09/06/2021 1:03 PM

## 2021-09-06 NOTE — ED Notes (Signed)
Patient transported to CT 

## 2021-09-06 NOTE — Transfer of Care (Signed)
Immediate Anesthesia Transfer of Care Note  Patient: Jenna Thomas  Procedure(s) Performed: EXPLORATORY LAPAROTOMY, SIGMOID COLECTOMY, END COLOSTOMY (Abdomen)  Patient Location: PACU  Anesthesia Type:General  Level of Consciousness: drowsy and patient cooperative  Airway & Oxygen Therapy: Patient Spontanous Breathing and Patient connected to face mask oxygen  Post-op Assessment: Report given to RN and Post -op Vital signs reviewed and stable  Post vital signs: Reviewed and stable  Last Vitals:  Vitals Value Taken Time  BP 114/82 09/06/21 2257  Temp 36.6 C 09/06/21 2257  Pulse 91 09/06/21 2300  Resp 18 09/06/21 2301  SpO2 93 % 09/06/21 2300  Vitals shown include unvalidated device data.  Last Pain:  Vitals:   09/06/21 1628  TempSrc: Oral  PainSc: 0-No pain         Complications: No notable events documented.

## 2021-09-06 NOTE — Consult Note (Signed)
Reason for Consult: abdominal pain, small bowel volvulus, leukocytosis  Referring Physician: Dr. Roosevelt Locks, TRH  Jenna Thomas is an 88 y.o. female.   HPI: Patient is an 88 year old female transferred [facility] [place] [facility] for surgical evaluation of probable small bowel volvulus, possible internal hernia, possible intestinal ischemia, and possible enterocolonic fistula.  Patient developed crampy abdominal pain 3 days prior to admission.  She has had intermittent diarrhea.  On evaluation at [place] she was noted to have a leukocytosis with a white count of 23,000.  Patient also had an elevated creatinine consistent with acute kidney injury.  A noncontrasted CT scan of the abdomen and pelvis demonstrated swirling of the small bowel mesentery in the right lower quadrant with evidence of partial obstruction, possible internal herniation, inflammation of the sigmoid colon with possible partial obstruction and the possibility of enterocolonic fistula.  There was hydronephrosis on the right felt to be secondary to the volvulus.  Patient has no prior history of abdominal surgery.  She may have had mild intermittent symptoms in the past.  She has no history of peptic ulcer disease or inflammatory bowel disease or diverticular disease.  Patient is accompanied by her son in the ICU.  Per the son, she does have evidence of early mild dementia.  Patient lives in the independent living section at [facility].  Past Medical History:  Diagnosis Date   Arthritis    back   Endometrial polyp    Hypertension    Hypothyroidism    Macular degeneration of both eyes    Osteopenia    PMB (postmenopausal bleeding)    Wears glasses     Past Surgical History:  Procedure Laterality Date   CATARACT EXTRACTION W/ INTRAOCULAR LENS  IMPLANT, BILATERAL  2012   COLONOSCOPY  last one 2014   [place] N/A 10/24/2018   Procedure: [place];  Surgeon: Molli Posey, MD;  Location: [facility];  Service: Gynecology;  Laterality: N/A;   TOTAL HIP ARTHROPLASTY Bilateral left 12-18-2005;  right 07-04-2009  dr Alvan Dame '@[facility]     Family History  Problem Relation Age of Onset   Heart disease Mother    Heart disease Father    Bladder Cancer Brother        former smoker   Colon cancer Neg Hx    Esophageal cancer Neg Hx    Rectal cancer Neg Hx    Stomach cancer Neg Hx    Breast cancer Neg Hx     Social History:  reports that she quit smoking about 26 years ago. Her smoking use included cigarettes. She has never used smokeless tobacco. She reports current alcohol use of about 3.0 standard drinks of alcohol per week. She reports that she does not use drugs.  Allergies:  Allergies  Allergen Reactions   Sulfa Antibiotics Other (See Comments)    Does not remember reaction    Medications: I have reviewed the patient's current medications.  Results for orders placed or performed during the hospital encounter of 09/06/21 (from the past 48 hour(s))  Lipase, blood     Status: None   Collection Time: 09/06/21 10:25 AM  Result Value Ref Range   Lipase 13 11 - 51 U/L    Comment: Performed at [facility], [address], [place], [place] [address]  Comprehensive metabolic panel     Status: Abnormal   Collection Time: 09/06/21 10:25 AM  Result Value Ref Range  Sodium 131 (L) 135 - 145 mmol/L   Potassium 4.2 3.5 - 5.1 mmol/L   Chloride 94 (L) 98 - 111 mmol/L   CO2 22 22 - 32 mmol/L   Glucose, Bld 220 (H) 70 - 99 mg/dL    Comment: Glucose reference range applies only to samples taken after fasting for at least 8 hours.   BUN 75 (H) 8 - 23 mg/dL   Creatinine, Ser 1.98 (H) 0.44 - 1.00 mg/dL   Calcium 9.5 8.9 - 10.3 mg/dL   Total Protein 7.0 6.5 - 8.1 g/dL   Albumin 3.4 (L) 3.5 - 5.0 g/dL   AST 14 (L) 15 - 41 U/L   ALT 14 0 - 44 U/L   Alkaline Phosphatase 65 38 - 126 U/L   Total Bilirubin  0.5 0.3 - 1.2 mg/dL   GFR, Estimated 24 (L) >60 mL/min    Comment: (NOTE) Calculated using the CKD-EPI Creatinine Equation (2021)    Anion gap 15 5 - 15    Comment: Performed at [facility], [address], [place], [place] [address]  CBC     Status: Abnormal   Collection Time: 09/06/21 10:25 AM  Result Value Ref Range   WBC 23.4 (H) 4.0 - 10.5 K/uL   RBC 3.62 (L) 3.87 - 5.11 MIL/uL   Hemoglobin 11.8 (L) 12.0 - 15.0 g/dL   HCT 35.8 (L) 36.0 - 46.0 %   MCV 98.9 80.0 - 100.0 fL   MCH 32.6 26.0 - 34.0 pg   MCHC 33.0 30.0 - 36.0 g/dL   RDW 13.4 11.5 - 15.5 %   Platelets 331 150 - 400 K/uL   nRBC 0.0 0.0 - 0.2 %    Comment: Performed at [facility], [address], [place], [place] [address]  Lactic acid, plasma     Status: None   Collection Time: 09/06/21 11:30 AM  Result Value Ref Range   Lactic Acid, Venous 1.8 0.5 - 1.9 mmol/L    Comment: Performed at [facility], [address] [place], [place] [address]  Protime-INR     Status: Abnormal   Collection Time: 09/06/21 11:30 AM  Result Value Ref Range   Prothrombin Time 16.6 (H) 11.4 - 15.2 seconds   INR 1.4 (H) 0.8 - 1.2    Comment: (NOTE) INR goal varies based on device and disease states. Performed at [facility], [address] [place], [place] [address]   APTT     Status: None   Collection Time: 09/06/21 11:30 AM  Result Value Ref Range   aPTT 28 24 - 36 seconds    Comment: Performed at [facility], [address], [place], [place] [address]  Resp Panel by RT-PCR (Flu A&B, Covid) Anterior Nasal Swab     Status: None   Collection Time: 09/06/21  2:38 PM   Specimen: Anterior Nasal Swab  Result Value Ref Range   SARS Coronavirus 2 by RT PCR NEGATIVE NEGATIVE    Comment: (NOTE) SARS-CoV-2 target nucleic acids are NOT DETECTED.  The SARS-CoV-2 RNA is generally detectable in upper respiratory specimens during the  acute phase of infection. The lowest concentration of SARS-CoV-2 viral copies this assay can detect is 138 copies/mL. A negative result does not preclude SARS-Cov-2 infection and should not be used as the sole basis for treatment or other patient management decisions. A negative result may occur with  improper specimen collection/handling, submission of specimen other than nasopharyngeal swab, presence of viral mutation(s) within  the areas targeted by this assay, and inadequate number of viral copies(<138 copies/mL). A negative result must be combined with clinical observations, patient history, and epidemiological information. The expected result is Negative.  Fact Sheet for Patients:  EntrepreneurPulse.com.au  Fact Sheet for Healthcare Providers:  IncredibleEmployment.be  This test is no t yet approved or cleared by the [place] FDA and  has been authorized for detection and/or diagnosis of SARS-CoV-2 by FDA under an Emergency Use Authorization (EUA). This EUA will remain  in effect (meaning this test can be used) for the duration of the COVID-19 declaration under Section 564(b)(1) of the Act, 21 U.S.C.section 360bbb-3(b)(1), unless the authorization is terminated  or revoked sooner.       Influenza A by PCR NEGATIVE NEGATIVE   Influenza B by PCR NEGATIVE NEGATIVE    Comment: (NOTE) The Xpert Xpress SARS-CoV-2/FLU/RSV plus assay is intended as an aid in the diagnosis of influenza from Nasopharyngeal swab specimens and should not be used as a sole basis for treatment. Nasal washings and aspirates are unacceptable for Xpert Xpress SARS-CoV-2/FLU/RSV testing.  Fact Sheet for Patients: EntrepreneurPulse.com.au  Fact Sheet for Healthcare Providers: IncredibleEmployment.be  This test is not yet approved or cleared by the [place] FDA and has been authorized for detection and/or diagnosis of  SARS-CoV-2 by FDA under an Emergency Use Authorization (EUA). This EUA will remain in effect (meaning this test can be used) for the duration of the COVID-19 declaration under Section 564(b)(1) of the Act, 21 U.S.C. section 360bbb-3(b)(1), unless the authorization is terminated or revoked.  Performed at [facility], [address] [place], [place] [address]   MRSA Next Gen by PCR, Nasal     Status: None   Collection Time: 09/06/21  4:22 PM   Specimen: Nasal Mucosa; Nasal Swab  Result Value Ref Range   MRSA by PCR Next Gen NOT DETECTED NOT DETECTED    Comment: (NOTE) The GeneXpert MRSA Assay (FDA approved for NASAL specimens only), is one component of a comprehensive MRSA colonization surveillance program. It is not intended to diagnose MRSA infection nor to guide or monitor treatment for MRSA infections. Test performance is not FDA approved in patients less than 39 years old. Performed at [facility], [place] [address] [place], [place] [address]   TSH     Status: Abnormal   Collection Time: 09/06/21  4:49 PM  Result Value Ref Range   TSH 8.025 (H) 0.350 - 4.500 uIU/mL    Comment: Performed by a 3rd Generation assay with a functional sensitivity of <=0.01 uIU/mL. Performed at [facility], [place] [address], [place], [place] [address]   Glucose, capillary     Status: Abnormal   Collection Time: 09/06/21  6:20 PM  Result Value Ref Range   Glucose-Capillary 122 (H) 70 - 99 mg/dL    Comment: Glucose reference range applies only to samples taken after fasting for at least 8 hours.   Comment 1 Notify RN    Comment 2 Document in Chart     CT ABDOMEN PELVIS WO CONTRAST  Result Date: 09/06/2021 CLINICAL DATA:  Acute abdominal pain. Mid abdominal pain for 3 days. EXAM: CT ABDOMEN AND PELVIS WITHOUT CONTRAST TECHNIQUE: Multidetector CT imaging of the abdomen and pelvis was performed following the standard protocol without IV  contrast. RADIATION DOSE REDUCTION: This exam was performed according to the departmental dose-optimization program which includes automated exposure control, adjustment of the mA and/or kV according to patient size and/or  use of iterative reconstruction technique. COMPARISON:  CT abdomen dated 09/23/2018. FINDINGS: Lower chest: No acute abnormality. Hepatobiliary: No focal liver abnormality is seen. Gallbladder is unremarkable. No bile duct dilatation seen. Pancreas: Unremarkable. No pancreatic ductal dilatation or surrounding inflammatory changes. Spleen: Normal in size without focal abnormality. Adrenals/Urinary Tract: Adrenal glands appear normal. Moderate RIGHT-sided hydronephrosis. LEFT kidney is unremarkable without stone or hydronephrosis. No RIGHT-sided ureteral stone identified. There is a complex collection of soft tissue thickening and gas within the RIGHT hemipelvis, with surrounding inflammation/fluid/phlegmon, which appears to be causing the hydronephrosis. Stomach/Bowel: Extensive soft tissue thickening and inflammatory change involving the small bowel of the RIGHT upper pelvis and abutting sigmoid colon (axial series 2, images 50 through 60; coronal series 5, images 35 through 48). There is tortuosity and narrowing of the small bowel in the RIGHT lower quadrant leading into this area of inflammation, raising the possibility of malrotation/volvulus or internal hernia with associated obstruction and/or ischemia. There is oral contrast within the sigmoid colon and within the inflamed-appearing small bowel loops overlying the sigmoid colon, also suspicious for enterocolic fistula. As above, this soft tissue thickening/inflammation and bowel wall thickening/inflammation is resulting in the moderate RIGHT-sided hydronephrosis. Extensive diverticulosis of the sigmoid colon in the LEFT pelvis. Distention of the transverse colon and LEFT colon, filled with stool suggesting some degree of associated colonic  obstruction. Mild prominence of the small bowel in the RIGHT lower quadrant, also suggesting a partial obstruction. Stomach is unremarkable. Vascular/Lymphatic: Extensive aortic atherosclerosis. Reproductive: Presumed hysterectomy.  No adnexal mass identified. Other: No free intraperitoneal air identified. Musculoskeletal: Degenerative spondylosis of the slightly scoliotic thoracolumbar spine. No acute-appearing osseous abnormality. IMPRESSION: 1. Marked thickening and inflammatory change involving the small bowel of the RIGHT upper pelvis and the adjacent/underlying sigmoid colon. There is tortuosity and narrowing of the small bowel in this area of inflammation, and tortuosity of the small bowel and mesentery of the overlying RIGHT lower quadrant, raising the possibility of malrotation/volvulus or internal hernia with resultant small bowel ischemia. 2. Additionally, there is oral contrast both within the abnormal-appearing sigmoid colon and within the inflamed-appearing small bowel loops abutting the sigmoid colon, suspicious for associated enterocolic fistula. 3. Distention of the transverse colon and LEFT colon, filled with stool, suggesting some degree of associated colonic obstruction. 4. Mildly dilated small bowel within the RIGHT lower quadrant, also suggesting partial obstruction. 5. Extensive diverticulosis of the sigmoid colon in the LEFT pelvis. Aortic Atherosclerosis (ICD10-I70.0). If additional imaging characterization were needed, would consider repeat CT abdomen and pelvis with oral and rectal contrast. These results and recommendations were called by telephone at the time of interpretation on 09/06/2021 at 12:30 pm to provider [facility] RAY , who verbally acknowledged these results. Electronically Signed   By: Franki Cabot M.D.   On: 09/06/2021 12:40   DG Chest Port 1 View  Result Date: 09/06/2021 CLINICAL DATA:  Fatigue and diarrhea.  Questionable sepsis. EXAM: PORTABLE CHEST 1 VIEW COMPARISON:   03/09/2015 FINDINGS: The lungs are clear without focal pneumonia, edema, pneumothorax or pleural effusion. The cardiopericardial silhouette is within normal limits for size. The visualized bony structures of the thorax are unremarkable. Telemetry leads overlie the chest. IMPRESSION: No active disease. Electronically Signed   By: Misty Stanley M.D.   On: 09/06/2021 12:08    Review of Systems  Constitutional: Negative.   HENT: Negative.    Eyes: Negative.   Respiratory: Negative.    Cardiovascular: Negative.   Gastrointestinal:  Positive for abdominal distention, abdominal  pain and diarrhea.  Endocrine: Negative.   Genitourinary: Negative.   Musculoskeletal:  Positive for back pain.  Skin: Negative.   Allergic/Immunologic: Negative.   Neurological: Negative.   Hematological: Negative.   Psychiatric/Behavioral:  Positive for confusion.     Physical Exam  Blood pressure (!) 127/55, pulse 79, temperature 97.6 F (36.4 C), temperature source Oral, resp. rate (!) 21, height '5\' 5"'$  (1.651 m), weight 45.5 kg, SpO2 98 %.  CONSTITUTIONAL: no acute distress; conversant; no obvious deformities  EYES: Conjunctiva clear and moist; pupils equal bilaterally  NECK: trachea midline; no thyroid nodularity  LUNGS: respiratory effort normal & unlabored; no wheeze; no rales  CV: rate and rhythm regular; no significant murmur; no edema bilat lower extremities  GI: abdomen is moderately distended, particularly in the lower abdomen, tense, with tenderness to palpation.  There are active bowel sounds on auscultation.  There are no surgical incisions.  There is no sign of hernia.  MSK: normal range of motion of extremities; no clubbing; no cyanosis  PSYCH: appropriate affect for situation; alert and oriented to person, place, & time  LYMPHATIC: no palpable cervical lymphadenopathy    Assessment/Plan:  Probable small bowel volvulus with partial obstruction Possible internal hernia Possible  colonic volvulus with partial obstruction Possible enterocolonic fistula Right hydronephrosis AKI Hyperglycemia  I reviewed the noncontrast CT scan from earlier this afternoon and discussed these findings at length with the patient and her son.  I also reviewed her laboratory studies.  I explained to them that we are suspicious that she has a small bowel volvulus resulting in partial obstruction and possible ischemia or infarction.  This may be due to an internal hernia.  There also appears to be some involvement of the sigmoid colon and based on the CT scan findings, there is a possibility of an enterocolonic fistula.  I have recommended proceeding with urgent laparotomy.  We discussed the operation through a midline incision.  We discussed the possible need for small bowel resection.  We discussed the possible need for partial colectomy with colostomy.  We discussed the alternative of nonoperative therapy which I have recommended against.  The patient and her son understand and agree to proceed with surgery this evening.  We have discussed the anticipated postoperative course and recovery.  The operating room has been notified and I have discussed the case with anesthesiology.  The ICU nursing staff has also been notified by myself.  Armandina Gemma, MD [facility] Surgery [facility] [place] [facility] Office: [phone]   Armandina Gemma 09/06/2021, 7:18 PM

## 2021-09-06 NOTE — ED Provider Notes (Signed)
[facility] EMERGENCY DEPT Provider Note   CSN: [id number] Arrival date & time: 09/06/21  1497     History  Chief Complaint  Patient presents with   Dizziness   Abdominal Cramping   Anorexia    Minimal appetite    Jenna Thomas is a 88 y.o. female.  HPI 88 year old female history of hypertension presents today complaining of abdominal pain, cramping, nausea, decreased p.o. intake and generalized weakness.  Symptoms began on Saturday.  She had some diarrhea reported by her daughter via her son on Sunday.  Patient does not report diarrhea.  Patient had labs obtained prior to my evaluation is noted that she has a white blood cell count of 02,637 c systolic blood pressure of 90     Home Medications Prior to Admission medications   Medication Sig Start Date End Date Taking? Authorizing Provider  Calcium Carb-Cholecalciferol (CALCIUM 600 + D PO) Take 1 tablet by mouth 2 (two) times daily.    [provider]  levothyroxine (SYNTHROID, LEVOTHROID) 75 MCG tablet Take 75 mcg by mouth daily before breakfast.     [provider]  losartan-hydrochlorothiazide (HYZAAR) 100-25 MG per tablet Take 1 tablet by mouth daily.     [provider]  Multiple Vitamins-Minerals (PRESERVISION AREDS 2) CAPS Take 1 capsule by mouth 2 (two) times daily.    [provider]  spironolactone (ALDACTONE) 25 MG tablet Take 25 mg by mouth daily.    [provider]      Allergies    Sulfa antibiotics    Review of Systems   Review of Systems  Physical Exam Updated Vital Signs BP (!) 146/76   Pulse 84   Temp 99.2 F (37.3 C) (Rectal)   Resp 20   Ht 1.549 m ('5\' 1"'$ )   Wt 48.1 kg   SpO2 99%   BMI 20.03 kg/m  Physical Exam Vitals and nursing note reviewed.  Constitutional:      Appearance: Normal appearance.  HENT:     Head: Normocephalic.     Right Ear: External ear normal.     Left Ear: External ear normal.     Nose: Nose normal.      Mouth/Throat:     Pharynx: Oropharynx is clear.  Eyes:     Extraocular Movements: Extraocular movements intact.     Pupils: Pupils are equal, round, and reactive to light.  Cardiovascular:     Rate and Rhythm: Normal rate and regular rhythm.     Pulses: Normal pulses.  Pulmonary:     Effort: Pulmonary effort is normal.  Abdominal:     General: Abdomen is flat.     Palpations: Abdomen is soft.     Tenderness: There is abdominal tenderness.  Musculoskeletal:        General: Normal range of motion.     Cervical back: Normal range of motion.  Skin:    General: Skin is warm and dry.     Capillary Refill: Capillary refill takes less than 2 seconds.  Neurological:     General: No focal deficit present.  Psychiatric:        Mood and Affect: Mood normal.     ED Results / Procedures / Treatments   Labs (all labs ordered are listed, but only abnormal results are displayed) Labs Reviewed  COMPREHENSIVE METABOLIC PANEL - Abnormal; Notable for the following components:      Result Value   Sodium 131 (*)    Chloride 94 (*)  Glucose, Bld 220 (*)    BUN 75 (*)    Creatinine, Ser 1.98 (*)    Albumin 3.4 (*)    AST 14 (*)    GFR, Estimated 24 (*)    All other components within normal limits  CBC - Abnormal; Notable for the following components:   WBC 23.4 (*)    RBC 3.62 (*)    Hemoglobin 11.8 (*)    HCT 35.8 (*)    All other components within normal limits  PROTIME-INR - Abnormal; Notable for the following components:   Prothrombin Time 16.6 (*)    INR 1.4 (*)    All other components within normal limits  RESP PANEL BY RT-PCR (FLU A&B, COVID) ARPGX2  CULTURE, BLOOD (ROUTINE X 2)  CULTURE, BLOOD (ROUTINE X 2)  URINE CULTURE  SARS CORONAVIRUS 2 BY RT PCR  LIPASE, BLOOD  LACTIC ACID, PLASMA  APTT  LACTIC ACID, PLASMA  URINALYSIS, ROUTINE W REFLEX MICROSCOPIC  LACTATE DEHYDROGENASE    EKG None  Radiology CT ABDOMEN PELVIS WO CONTRAST  Result Date: 09/06/2021 CLINICAL  DATA:  Acute abdominal pain. Mid abdominal pain for 3 days. EXAM: CT ABDOMEN AND PELVIS WITHOUT CONTRAST TECHNIQUE: Multidetector CT imaging of the abdomen and pelvis was performed following the standard protocol without IV contrast. RADIATION DOSE REDUCTION: This exam was performed according to the departmental dose-optimization program which includes automated exposure control, adjustment of the mA and/or kV according to patient size and/or use of iterative reconstruction technique. COMPARISON:  CT abdomen dated 09/23/2018. FINDINGS: Lower chest: No acute abnormality. Hepatobiliary: No focal liver abnormality is seen. Gallbladder is unremarkable. No bile duct dilatation seen. Pancreas: Unremarkable. No pancreatic ductal dilatation or surrounding inflammatory changes. Spleen: Normal in size without focal abnormality. Adrenals/Urinary Tract: Adrenal glands appear normal. Moderate RIGHT-sided hydronephrosis. LEFT kidney is unremarkable without stone or hydronephrosis. No RIGHT-sided ureteral stone identified. There is a complex collection of soft tissue thickening and gas within the RIGHT hemipelvis, with surrounding inflammation/fluid/phlegmon, which appears to be causing the hydronephrosis. Stomach/Bowel: Extensive soft tissue thickening and inflammatory change involving the small bowel of the RIGHT upper pelvis and abutting sigmoid colon (axial series 2, images 50 through 60; coronal series 5, images 35 through 48). There is tortuosity and narrowing of the small bowel in the RIGHT lower quadrant leading into this area of inflammation, raising the possibility of malrotation/volvulus or internal hernia with associated obstruction and/or ischemia. There is oral contrast within the sigmoid colon and within the inflamed-appearing small bowel loops overlying the sigmoid colon, also suspicious for enterocolic fistula. As above, this soft tissue thickening/inflammation and bowel wall thickening/inflammation is resulting  in the moderate RIGHT-sided hydronephrosis. Extensive diverticulosis of the sigmoid colon in the LEFT pelvis. Distention of the transverse colon and LEFT colon, filled with stool suggesting some degree of associated colonic obstruction. Mild prominence of the small bowel in the RIGHT lower quadrant, also suggesting a partial obstruction. Stomach is unremarkable. Vascular/Lymphatic: Extensive aortic atherosclerosis. Reproductive: Presumed hysterectomy.  No adnexal mass identified. Other: No free intraperitoneal air identified. Musculoskeletal: Degenerative spondylosis of the slightly scoliotic thoracolumbar spine. No acute-appearing osseous abnormality. IMPRESSION: 1. Marked thickening and inflammatory change involving the small bowel of the RIGHT upper pelvis and the adjacent/underlying sigmoid colon. There is tortuosity and narrowing of the small bowel in this area of inflammation, and tortuosity of the small bowel and mesentery of the overlying RIGHT lower quadrant, raising the possibility of malrotation/volvulus or internal hernia with resultant small bowel ischemia. 2. Additionally,  there is oral contrast both within the abnormal-appearing sigmoid colon and within the inflamed-appearing small bowel loops abutting the sigmoid colon, suspicious for associated enterocolic fistula. 3. Distention of the transverse colon and LEFT colon, filled with stool, suggesting some degree of associated colonic obstruction. 4. Mildly dilated small bowel within the RIGHT lower quadrant, also suggesting partial obstruction. 5. Extensive diverticulosis of the sigmoid colon in the LEFT pelvis. Aortic Atherosclerosis (ICD10-I70.0). If additional imaging characterization were needed, would consider repeat CT abdomen and pelvis with oral and rectal contrast. These results and recommendations were called by telephone at the time of interpretation on 09/06/2021 at 12:30 pm to provider [facility] Chabeli Barsamian , who verbally acknowledged these results.  Electronically Signed   By: Franki Cabot M.D.   On: 09/06/2021 12:40   DG Chest Port 1 View  Result Date: 09/06/2021 CLINICAL DATA:  Fatigue and diarrhea.  Questionable sepsis. EXAM: PORTABLE CHEST 1 VIEW COMPARISON:  03/09/2015 FINDINGS: The lungs are clear without focal pneumonia, edema, pneumothorax or pleural effusion. The cardiopericardial silhouette is within normal limits for size. The visualized bony structures of the thorax are unremarkable. Telemetry leads overlie the chest. IMPRESSION: No active disease. Electronically Signed   By: Misty Stanley M.D.   On: 09/06/2021 12:08    Procedures Procedures    Medications Ordered in ED Medications  lactated ringers infusion (has no administration in time range)  ceFEPIme (MAXIPIME) 2 g in sodium chloride 0.9 % 100 mL IVPB (has no administration in time range)  0.9 %  sodium chloride infusion (has no administration in time range)  lactated ringers bolus 1,000 mL (0 mLs Intravenous Stopped 09/06/21 1317)  ceFEPIme (MAXIPIME) 2 g in sodium chloride 0.9 % 100 mL IVPB (0 g Intravenous Stopped 09/06/21 1404)  metroNIDAZOLE (FLAGYL) IVPB 500 mg (500 mg Intravenous New Bag/Given 09/06/21 1420)  lactated ringers bolus 500 mL (500 mLs Intravenous New Bag/Given 09/06/21 1417)    ED Course/ Medical Decision Making/ A&P Clinical Course as of 09/06/21 1526  Tue Sep 06, 2021  1229 CT ABDOMEN PELVIS WO CONTRAST Received call from radiologist with concern for bowel ischemia and obstruction [DR]  1230 CT ABDOMEN PELVIS WO CONTRAST Broad-spectrum antibiotics ordered [DR]  1514 CBC(!) CBC reviewed interpreted and white blood cell count significantly elevated at 23,400 Minor anemia with hemoglobin 11.8 Platelets normal at 331,000 [DR]  1514 Comprehensive metabolic panel(!) Complete metabolic panel reviewed and interpreted significant for hyperglycemia with glucose of 220 BUN and creatinine elevated at 75 and 1.98 with the last being measured 2 years ago and  30s and 1.4 [DR]    Clinical Course User Index [DR] Pattricia Boss, MD                           Medical Decision Making 88 year old female with generalized malaise, weakness, increased confusion who presents today for evaluation.  Labs obtained prior to my evaluation were significant for leukocytosis.  Due to age and symptoms with leukocytosis, abdominal pain on exam, broad-spectrum antibiotics were initiated.  Patient has received 1 L normal saline. CT reviewed with Dr. Enriqueta Shutter, radiologist who read the initial film.  Multiple abnormalities were noted and he advises that if more definitive diagnosis is required they could repeat exams with additional p.o. and rectal contrast. I discussed the CT results including possible small bowel obstruction, inflammation of the small bowel and large bowel, and possible enterocolic fistula as well as collection of soft tissue thickening and gas in  the right hemipelvis possible phlegmon, and right hydronephrosis.  The above was discussed with Dr. Michaelle Birks, on-call for general surgery and she will review and advise. 88 year old with generalized Symptoms broad differential diagnosis including but not limited to, and which was considered considered includes severe illness including sepsis, oncologic etiologies metabolic crises Given patient's leukocytosis, abdominal pain, and abnormalities noted on CT scan, intra-abdominal is likely etiology.  She does have acute metabolic abnormalities consistent with infection.  She does not have overt sepsis with normal lactic and has remained hemodynamically stable with normal blood pressure and heart rate here in the ED. Patient's son is at the bedside.  We have discussed CODE STATUS and interventions.  Currently the patient wishes to be full code.  We have discussed that she is likely to get sicker and may require an operation.  The son and siblings are discussing status regarding surgery. Discussed admission with Dr. Shelton Silvas,  on-call for hospitalist.  He will admit patient to stepdown bed. Patient is still pending Foley catheter placement, urinalysis, and will add COVID.  Amount and/or Complexity of Data Reviewed Labs: ordered. Decision-making details documented in ED Course. Radiology: ordered and independent interpretation performed. Decision-making details documented in ED Course. ECG/medicine tests: ordered and independent interpretation performed. Decision-making details documented in ED Course. Discussion of management or test interpretation with external provider(s): Discussed with Dr. Enriqueta Shutter, radiology Discussed with Dr. Zenia Resides, general surgery Discussed with Dr. Shelton Silvas, hospitalist  Risk Prescription drug management. Parenteral controlled substances. Decision regarding hospitalization. Decision not to resuscitate or to de-escalate care because of poor prognosis.  Critical Care Total time providing critical care: 65 minutes           Final Clinical Impression(s) / ED Diagnoses Final diagnoses:  Abdominal infection ([place])  AKI (acute kidney injury) [facility])    Rx / DC Orders ED Discharge Orders     None         Pattricia Boss, MD 09/06/21 1526

## 2021-09-06 NOTE — Op Note (Signed)
Operative Note  Pre-operative Diagnosis:  small bowel volvulus, partial obstruction  Post-operative Diagnosis:  infarcted sigmoid colon, diverticular disease  Surgeon:  Armandina Gemma, MD  Assistant:  none   Procedure:  Exploratory laparotomy, sigmoid colectomy, end descending colostomy  Anesthesia:  general  Estimated Blood Loss:  50 cc  Drains: 61 Fr Blake drain in pelvis         Specimen: sigmoid colon to pathology  Indications:  Patient is an 88 year old female transferred from [place] at [facility] for surgical evaluation of probable small bowel volvulus, possible internal hernia, possible intestinal ischemia, and possible enterocolonic fistula.  Patient developed crampy abdominal pain 3 days prior to admission.  She has had intermittent diarrhea.  On evaluation at [facility] she was noted to have a leukocytosis with a white count of 23,000.  Patient also had an elevated creatinine consistent with acute kidney injury.  A noncontrasted CT scan of the abdomen and pelvis demonstrated swirling of the small bowel mesentery in the right lower quadrant with evidence of partial obstruction, possible internal herniation, inflammation of the sigmoid colon with possible partial obstruction and the possibility of enterocolonic fistula.  There was hydronephrosis on the right felt to be secondary to the volvulus.  Patient has no prior history of abdominal surgery.  She may have had mild intermittent symptoms in the past.  She has no history of peptic ulcer disease or inflammatory bowel disease or diverticular disease.  Procedure:  The patient was seen in the pre-op holding area. The risks, benefits, complications, treatment options, and expected outcomes were previously discussed with the patient. The patient agreed with the proposed plan and has signed the informed consent form.  The patient was brought to the operating room by the surgical team, identified as Jenna Thomas and the procedure  verified. A "time out" was completed and the above information confirmed.  Following administration of general anesthesia, the patient was positioned and then prepped and draped in the usual aseptic fashion.  After ascertaining that an adequate level of anesthesia been achieved, a lower midline abdominal incision is made with a #10 blade.  Dissection was carried through subcutaneous tissues and hemostasis achieved with the electrocautery.  Fascia was incised in the midline and the peritoneal cavity is entered cautiously.  Approximately 200 cc of clear ascitic fluid is evacuated from the abdomen.  Small bowel appears viable.  Small bowel is densely adherent to the pelvis.  This is gently mobilized with blunt dissection.  All of the small bowel is delivered from the pelvis.  The appendix is visualized and is normal.  Cecum is relatively mobile but normal.  The distal descending colon and proximal sigmoid colon are dilated and have significant number of diverticuli.  The sigmoid colon goes behind the left adnexa and the uterus where it is densely adherent.  There is a central pelvic dilated mass with full-thickness necrosis but without perforation.  This is gently mobilized away from the posterior aspect of the uterus and the right adnexa.  It is difficult initially to discern the anatomy.  The left ovary and fallopian tube are sharply dissected off of the underlying sigmoid colon.  Vascular structures are divided and ligated with 2-0 silk ties and 2-0 silk suture ligatures.  The mid and distal sigmoid colon is then gently mobilized allowing for full visualization.  It appears that the sigmoid colon has a partial obstruction due to an acute angle in the pelvis and then an area of full-thickness infarction just above the  peritoneal reflection in the pelvis.  The distal descending colon is then transected with a GIA stapler.  The mesentery of the sigmoid colon is taken down using the LigaSure.  Care is taken to  remain relatively close to the colon in order to avoid the ureter and other structures.  Dissection is carried distally mobilizing the entire dilated portion of the sigmoid colon out of the pelvis and carrying the dissection down to the pelvic peritoneal reflection.  At this point the bowel appears viable as it enters the rectum and the bowel is transected using a TA 60 stapler.  The sigmoid colon is removed from the abdomen and passed off the field.  Pelvis is irrigated with warm saline.  The ends of the rectal staple line are marked with 2-0 Prolene sutures.  Good hemostasis is noted.  The distal descending colon is then mobilized from its lateral peritoneal attachments.  An elliptical incision is made in the left lower quadrant of the abdominal wall using the electrocautery.  A plug of adipose tissue is excised.  A cruciate incision is made through the abdominal wall.  Using a Babcock clamp the end of the distal descending colon is delivered through the abdominal wall.  2-0 silk sutures are placed circumferentially in the fascia to the bowel wall to secure the bowel in place for colostomy.  Abdomen is then copiously irrigated with warm saline which is evacuated.  Small bowel is run from the ligament of Treitz to the ileocecal valve.  The area of the small bowel which was adhered in the pelvis shows some inflammatory changes but is viable and without perforation.  A 19 [place] Blake drain is brought in through a stab wound in the right lower quadrant of the abdomen and placed in the pelvis adjacent to the rectal staple line.  The drain is secured to the skin with a 2-0 nylon suture.  Midline abdominal incision is then closed with interrupted 0 Novafil simple sutures.  Subcutaneous tissues are irrigated.  Skin is closed with stainless steel staples.  The staple line at the colostomy site is excised with the electrocautery.  The colostomy is then matured to the skin edges circumferentially with interrupted 3-0  Vicryl sutures.  Wounds are washed and dried.  A colostomy appliance is applied.  Honeycomb dressing is placed over the midline incision.  Drain is placed to bulb suction.  Patient is awakened from anesthesia and transported to the recovery room.  The patient tolerated the procedure well.   Armandina Gemma, [facility]: [phone]

## 2021-09-06 NOTE — Plan of Care (Addendum)
[facility] -> [facility]  Erie Sica (DOB 2031/12/28)  Being asked to be admitted at the request of the EDP Pryor Curia for abdominal discomfort, poor p.o. intake and generalized weakness.  Patient is an 88 year old female with a past medical history significant for but limited to hypertension and hypothyroidism as well as other comorbidities who presents with abdominal cramping, pain, nausea, decreased p.o. intake and generalized weakness since Saturday.  She had some associated diarrhea reported by her daughter-in-law but she does not report any diarrhea now.  Labs were obtained and showed WBC of 23,000 and blood pressures on the softer side.  Given her abdominal tenderness a CT scan of the abdomen and pelvis was done and showed:  "1. Marked thickening and inflammatory change involving the small bowel of the RIGHT upper pelvis and the adjacent/underlying sigmoid colon. There is tortuosity and narrowing of the small bowel in this area of inflammation, and tortuosity of the small bowel and mesentery of the overlying RIGHT lower quadrant, raising the possibility of malrotation/volvulus or internal hernia with resultant small bowel ischemia. 2. Additionally, there is oral contrast both within the abnormal-appearing sigmoid colon and within the inflamed-appearing small bowel loops abutting the sigmoid colon, suspicious for associated enterocolic fistula. 3. Distention of the transverse colon and LEFT colon, filled with stool, suggesting some degree of associated colonic obstruction. 4. Mildly dilated small bowel within the RIGHT lower quadrant, also suggesting partial obstruction. 5. Extensive diverticulosis of the sigmoid colon in the LEFT pelvis.   Aortic Atherosclerosis (ICD10-I70.0)."  CT scan also showed some marked right-sided hydronephrosis likely secondary to above.  Lactic acid level was normal but LDH is still pending.  EDP discussed with general surgery Dr. Michaelle Birks who will evaluate the  patient's scan personally but did not feel the patient needed urgent surgical intervention at this time.  General surgery is to be notified when she arrives.  Given her high risk for decompensation she was Accepted to the stepdown unit and the EDP give her some fluids, antibiotics and blood cultures x2 were obtained and urine culture still pending.

## 2021-09-06 NOTE — Progress Notes (Signed)
Pharmacy Antibiotic Note  Jenna Thomas is a 88 y.o. female admitted on 09/06/2021 with worsening abdominal pain. CT abdomen pelvis showed extensive findings, including concern for malrotation/volvulus or internal hernia with resultant small bowel ischemia, enterocolic fistula, colonic obstruction, partial small bowel obstruction, and right-sided hydronephrosis.Pharmacy has been consulted by Surgery for Zosyn dosing.  Plan: Zosyn 2.25g IV q8h Monitor renal function, cultures, clinical course, duration of therapy    Height: '5\' 5"'$  (165.1 cm) Weight: 45.5 kg (100 lb 5 oz) IBW/kg (Calculated) : 57  Temp (24hrs), Avg:98.5 F (36.9 C), Min:97.6 F (36.4 C), Max:99.2 F (37.3 C)  Recent Labs  Lab 09/06/21 1025 09/06/21 1130  WBC 23.4*  --   CREATININE 1.98*  --   LATICACIDVEN  --  1.8     Estimated Creatinine Clearance: 14.1 mL/min (A) (by C-G formula based on SCr of 1.98 mg/dL (H)).    Allergies  Allergen Reactions   Sulfa Antibiotics Other (See Comments)    Does not remember reaction    Antimicrobials this admission: 7/4 Cefepime x 1  7/4 Metronidazole x 1 7/4 Zosyn >>  Dose adjustments this admission: --  Microbiology results: 7/4 BCx: sent 7/4 MRSA PCR: negative   Thank you for allowing pharmacy to be a part of this patient's care.   Lindell Spar, PharmD, BCPS Clinical Pharmacist  09/06/2021 7:44 PM

## 2021-09-06 NOTE — H&P (Signed)
History and Physical    Jenna Thomas VQQ:[id number] DOB: 03-19-1931 DOA: 09/06/2021  PCP: Sueanne Margarita, DO (Confirm with patient/family/NH records and if not entered, this has to be entered at [facility] point of entry) Patient coming from: Home  I have personally briefly reviewed patient's old medical records in [place]  Chief Complaint: Abdominal pain, feeling weak  HPI: Jenna Thomas is a 88 y.o. female with medical history significant of hypothyroidism, HTN, came with worsening of abdominal pain.  Symptoms started 3 days ago and initially was episodes of cramping-like right-sided abdominal pain, associated with nausea and loose bowel movement.  Over the weekend, the pain has been progressively getting worse, last bowel movement was yesterday morning.  This morning, patient started to feel worsening of right-sided abdominal pain more constant, associated with nausea but no vomiting no bowel movement since yesterday morning.  No fever or chills.  No previous intra-abdominal surgery. ED Course: Blood pressure borderline low, no tachycardia afebrile.  CBC 23, creatinine 1.9 compared to baseline 1.4, BUN 75, glucose 220  CT abdomen pelvis showed extensive findings, including volvulus/malrotation, enterocolitis, fistula, chronic obstruction versus ischemia, and right-sided hydronephrosis.  Patient was placed on n.p.o. and IV fluids and transferred to [facility] for further management.  General surgery consulted.   Review of Systems: As per HPI otherwise 14 point review of systems negative.    Past Medical History:  Diagnosis Date   Arthritis    back   Endometrial polyp    Hypertension    Hypothyroidism    Macular degeneration of both eyes    Osteopenia    PMB (postmenopausal bleeding)    Wears glasses     Past Surgical History:  Procedure Laterality Date   CATARACT EXTRACTION W/ INTRAOCULAR LENS  IMPLANT, BILATERAL  2012   COLONOSCOPY  last one 2014    [place] N/A 10/24/2018   Procedure: Marrowbone;  Surgeon: Molli Posey, MD;  Location: [facility];  Service: Gynecology;  Laterality: N/A;   TOTAL HIP ARTHROPLASTY Bilateral left 12-18-2005;  right 07-04-2009  dr Alvan Dame '@[facility]'$      reports that she quit smoking about 26 years ago. Her smoking use included cigarettes. She has never used smokeless tobacco. She reports current alcohol use of about 3.0 standard drinks of alcohol per week. She reports that she does not use drugs.  Allergies  Allergen Reactions   Sulfa Antibiotics Other (See Comments)    Does not remember reaction    Family History  Problem Relation Age of Onset   Heart disease Mother    Heart disease Father    Bladder Cancer Brother        former smoker   Colon cancer Neg Hx    Esophageal cancer Neg Hx    Rectal cancer Neg Hx    Stomach cancer Neg Hx    Breast cancer Neg Hx      Prior to Admission medications   Medication Sig Start Date End Date Taking? Authorizing Provider  Calcium Carb-Cholecalciferol (CALCIUM 600 + D PO) Take 1 tablet by mouth 2 (two) times daily.    [provider]  levothyroxine (SYNTHROID, LEVOTHROID) 75 MCG tablet Take 75 mcg by mouth daily before breakfast.     [provider]  losartan-hydrochlorothiazide (HYZAAR) 100-25 MG per tablet Take 1 tablet by mouth daily.     [provider]  Multiple Vitamins-Minerals (PRESERVISION AREDS 2) CAPS Take 1 capsule by mouth  2 (two) times daily.    [provider]  spironolactone (ALDACTONE) 25 MG tablet Take 25 mg by mouth daily.    [provider]    Physical Exam: Vitals:   09/06/21 1309 09/06/21 1330 09/06/21 1500 09/06/21 1628  BP: (!) 146/72 (!) 146/76 (!) 153/74 (!) 140/52  Pulse: 82 84 85 81  Resp: '20 20 20 '$ (!) 23  Temp:    97.6 F (36.4 C)  TempSrc:    Oral  SpO2: 98% 99% 98% 98%  Weight:    45.5 kg   Height:    '5\' 5"'$  (1.651 m)    Constitutional: NAD, calm, comfortable Vitals:   09/06/21 1309 09/06/21 1330 09/06/21 1500 09/06/21 1628  BP: (!) 146/72 (!) 146/76 (!) 153/74 (!) 140/52  Pulse: 82 84 85 81  Resp: '20 20 20 '$ (!) 23  Temp:    97.6 F (36.4 C)  TempSrc:    Oral  SpO2: 98% 99% 98% 98%  Weight:    45.5 kg  Height:    '5\' 5"'$  (1.651 m)   Eyes: PERRL, lids and conjunctivae normal ENMT: Mucous membranes are moist. Posterior pharynx clear of any exudate or lesions.Normal dentition.  Neck: normal, supple, no masses, no thyromegaly Respiratory: clear to auscultation bilaterally, no wheezing, no crackles. Normal respiratory effort. No accessory muscle use.  Cardiovascular: Regular rate and rhythm, no murmurs / rubs / gallops. No extremity edema. 2+ pedal pulses. No carotid bruits.  Abdomen: Mild tenderness on right side of periumbilical rare, no rebound no guarding, no masses palpated. No hepatosplenomegaly. Bowel sounds positive.  Musculoskeletal: no clubbing / cyanosis. No joint deformity upper and lower extremities. Good ROM, no contractures. Normal muscle tone.  Skin: no rashes, lesions, ulcers. No induration Neurologic: CN 2-12 grossly intact. Sensation intact, DTR normal. Strength 5/5 in all 4.  Psychiatric: Normal judgment and insight. Alert and oriented x 3. Normal mood.     Labs on Admission: I have personally reviewed following labs and imaging studies  CBC: Recent Labs  Lab 09/06/21 1025  WBC 23.4*  HGB 11.8*  HCT 35.8*  MCV 98.9  PLT 376   Basic Metabolic Panel: Recent Labs  Lab 09/06/21 1025  NA 131*  K 4.2  CL 94*  CO2 22  GLUCOSE 220*  BUN 75*  CREATININE 1.98*  CALCIUM 9.5   GFR: Estimated Creatinine Clearance: 14.1 mL/min (A) (by C-G formula based on SCr of 1.98 mg/dL (H)). Liver Function Tests: Recent Labs  Lab 09/06/21 1025  AST 14*  ALT 14  ALKPHOS 65  BILITOT 0.5  PROT 7.0  ALBUMIN 3.4*   Recent Labs  Lab 09/06/21 1025   LIPASE 13   No results for input(s): "AMMONIA" in the last 168 hours. Coagulation Profile: Recent Labs  Lab 09/06/21 1130  INR 1.4*   Cardiac Enzymes: No results for input(s): "CKTOTAL", "CKMB", "CKMBINDEX", "TROPONINI" in the last 168 hours. BNP (last 3 results) No results for input(s): "PROBNP" in the last 8760 hours. HbA1C: No results for input(s): "HGBA1C" in the last 72 hours. CBG: No results for input(s): "GLUCAP" in the last 168 hours. Lipid Profile: No results for input(s): "CHOL", "HDL", "LDLCALC", "TRIG", "CHOLHDL", "LDLDIRECT" in the last 72 hours. Thyroid Function Tests: No results for input(s): "TSH", "T4TOTAL", "FREET4", "T3FREE", "THYROIDAB" in the last 72 hours. Anemia Panel: No results for input(s): "VITAMINB12", "FOLATE", "FERRITIN", "TIBC", "IRON", "RETICCTPCT" in the last 72 hours. Urine analysis:    Component Value Date/Time   COLORURINE YELLOW 07/04/2009 0310  APPEARANCEUR CLEAR 07/04/2009 0310   LABSPEC 1.017 07/04/2009 0310   PHURINE 6.5 07/04/2009 0310   GLUCOSEU NEGATIVE 07/04/2009 0310   HGBUR TRACE (A) 07/04/2009 0310   BILIRUBINUR NEGATIVE 07/04/2009 0310   KETONESUR TRACE (A) 07/04/2009 0310   PROTEINUR NEGATIVE 07/04/2009 0310   UROBILINOGEN 0.2 07/04/2009 0310   NITRITE NEGATIVE 07/04/2009 0310   LEUKOCYTESUR NEGATIVE 07/04/2009 0310    Radiological Exams on Admission: CT ABDOMEN PELVIS WO CONTRAST  Result Date: 09/06/2021 CLINICAL DATA:  Acute abdominal pain. Mid abdominal pain for 3 days. EXAM: CT ABDOMEN AND PELVIS WITHOUT CONTRAST TECHNIQUE: Multidetector CT imaging of the abdomen and pelvis was performed following the standard protocol without IV contrast. RADIATION DOSE REDUCTION: This exam was performed according to the departmental dose-optimization program which includes automated exposure control, adjustment of the mA and/or kV according to patient size and/or use of iterative reconstruction technique. COMPARISON:  CT abdomen  dated 09/23/2018. FINDINGS: Lower chest: No acute abnormality. Hepatobiliary: No focal liver abnormality is seen. Gallbladder is unremarkable. No bile duct dilatation seen. Pancreas: Unremarkable. No pancreatic ductal dilatation or surrounding inflammatory changes. Spleen: Normal in size without focal abnormality. Adrenals/Urinary Tract: Adrenal glands appear normal. Moderate RIGHT-sided hydronephrosis. LEFT kidney is unremarkable without stone or hydronephrosis. No RIGHT-sided ureteral stone identified. There is a complex collection of soft tissue thickening and gas within the RIGHT hemipelvis, with surrounding inflammation/fluid/phlegmon, which appears to be causing the hydronephrosis. Stomach/Bowel: Extensive soft tissue thickening and inflammatory change involving the small bowel of the RIGHT upper pelvis and abutting sigmoid colon (axial series 2, images 50 through 60; coronal series 5, images 35 through 48). There is tortuosity and narrowing of the small bowel in the RIGHT lower quadrant leading into this area of inflammation, raising the possibility of malrotation/volvulus or internal hernia with associated obstruction and/or ischemia. There is oral contrast within the sigmoid colon and within the inflamed-appearing small bowel loops overlying the sigmoid colon, also suspicious for enterocolic fistula. As above, this soft tissue thickening/inflammation and bowel wall thickening/inflammation is resulting in the moderate RIGHT-sided hydronephrosis. Extensive diverticulosis of the sigmoid colon in the LEFT pelvis. Distention of the transverse colon and LEFT colon, filled with stool suggesting some degree of associated colonic obstruction. Mild prominence of the small bowel in the RIGHT lower quadrant, also suggesting a partial obstruction. Stomach is unremarkable. Vascular/Lymphatic: Extensive aortic atherosclerosis. Reproductive: Presumed hysterectomy.  No adnexal mass identified. Other: No free intraperitoneal  air identified. Musculoskeletal: Degenerative spondylosis of the slightly scoliotic thoracolumbar spine. No acute-appearing osseous abnormality. IMPRESSION: 1. Marked thickening and inflammatory change involving the small bowel of the RIGHT upper pelvis and the adjacent/underlying sigmoid colon. There is tortuosity and narrowing of the small bowel in this area of inflammation, and tortuosity of the small bowel and mesentery of the overlying RIGHT lower quadrant, raising the possibility of malrotation/volvulus or internal hernia with resultant small bowel ischemia. 2. Additionally, there is oral contrast both within the abnormal-appearing sigmoid colon and within the inflamed-appearing small bowel loops abutting the sigmoid colon, suspicious for associated enterocolic fistula. 3. Distention of the transverse colon and LEFT colon, filled with stool, suggesting some degree of associated colonic obstruction. 4. Mildly dilated small bowel within the RIGHT lower quadrant, also suggesting partial obstruction. 5. Extensive diverticulosis of the sigmoid colon in the LEFT pelvis. Aortic Atherosclerosis (ICD10-I70.0). If additional imaging characterization were needed, would consider repeat CT abdomen and pelvis with oral and rectal contrast. These results and recommendations were called by telephone at the time of  interpretation on 09/06/2021 at 12:30 pm to provider [facility] RAY , who verbally acknowledged these results. Electronically Signed   By: Franki Cabot M.D.   On: 09/06/2021 12:40   DG Chest Port 1 View  Result Date: 09/06/2021 CLINICAL DATA:  Fatigue and diarrhea.  Questionable sepsis. EXAM: PORTABLE CHEST 1 VIEW COMPARISON:  03/09/2015 FINDINGS: The lungs are clear without focal pneumonia, edema, pneumothorax or pleural effusion. The cardiopericardial silhouette is within normal limits for size. The visualized bony structures of the thorax are unremarkable. Telemetry leads overlie the chest. IMPRESSION: No active  disease. Electronically Signed   By: Misty Stanley M.D.   On: 09/06/2021 12:08    EKG: Independently reviewed.  Sinus, no acute ST changes.  Assessment/Plan Principal Problem:   Bowel obstruction (HCC) Active Problems:   AKI (acute kidney injury) ([place])  (please populate well all problems here in Problem List. (For example, if patient is on BP meds at home and you resume or decide to hold them, it is a problem that needs to be her. 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 -Etiology unclear -But with the extensive changes as above, general surgery consulted.  Discussed case with Dr. Harlow Asa, who will discuss with patient and her POA/son. -Continue n.p.o., IV fluids and pain medications -Prognosis hard to evaluate this point, but given the significant image study changes, bowel perforation/ischemia is high.  Discussed with the patient, patient wished to leave decision making to his son/POA.  Right-sided hydronephrosis -Appears to be related to malrotation/volvulus bowel on the same side -Discussed with on-call urology Dr. Lovena Neighbours, who feels since anatomically the right-sided hydro is caused by volvulus, solution is for general surgery and to release the volvulus bowel.  Ureteral stenting less likely to address the current problem  AKI -Probably postrenal from the right-sided hydro, IV fluid and rest management as above.  HTN -Hold home BP meds, as needed hydralazine for now  DVT prophylaxis: Heparin subcu Code Status: Full code as per patient which Family Communication: Son is on his way to the hospital Disposition Plan: Patient sick with acute abdomen with extensive image changes, recurrent n.p.o. IV fluids pain meds and surgical intervention likely.  Expect more than 2 midnight hospital  stay. Consults called: General surgery and urology Admission status: Stepdown unit   Lequita Halt MD [facility] Pager [phone]  09/06/2021, 5:22 PM

## 2021-09-06 NOTE — Anesthesia Procedure Notes (Addendum)
Procedure Name: Intubation Date/Time: 09/06/2021 8:35 PM  Performed by: Montel Clock, CRNAPre-anesthesia Checklist: Patient identified, Emergency Drugs available, Suction available, Patient being monitored and Timeout performed Patient Re-evaluated:Patient Re-evaluated prior to induction Oxygen Delivery Method: Circle system utilized Preoxygenation: Pre-oxygenation with 100% oxygen Induction Type: IV induction Ventilation: Mask ventilation without difficulty Laryngoscope Size: Mac and 3 Grade View: Grade II Tube type: Oral Tube size: 7.5 mm Number of attempts: 1 Airway Equipment and Method: Stylet Placement Confirmation: ETT inserted through vocal cords under direct vision, positive ETCO2 and breath sounds checked- equal and bilateral Secured at: 21 cm Tube secured with: Tape Dental Injury: Teeth and Oropharynx as per pre-operative assessment

## 2021-09-06 NOTE — Progress Notes (Signed)
Elink following code sepsis °

## 2021-09-06 NOTE — ED Triage Notes (Signed)
Patient arrives with complaints of fatigue, diarrhea, and minimal appetite x3 days. Patient also reports abdominal cramping as well. No known sick exposures.   Arrives with family member who states she has been getting confused as well.

## 2021-09-07 ENCOUNTER — Inpatient Hospital Stay (HOSPITAL_COMMUNITY): Payer: Medicare Other

## 2021-09-07 ENCOUNTER — Encounter (HOSPITAL_COMMUNITY): Payer: Self-pay | Admitting: Surgery

## 2021-09-07 DIAGNOSIS — K562 Volvulus: Secondary | ICD-10-CM | POA: Diagnosis not present

## 2021-09-07 LAB — CBC
HCT: 32.7 % — ABNORMAL LOW (ref 36.0–46.0)
Hemoglobin: 10.7 g/dL — ABNORMAL LOW (ref 12.0–15.0)
MCH: 32.7 pg (ref 26.0–34.0)
MCHC: 32.7 g/dL (ref 30.0–36.0)
MCV: 100 fL (ref 80.0–100.0)
Platelets: 277 10*3/uL (ref 150–400)
RBC: 3.27 MIL/uL — ABNORMAL LOW (ref 3.87–5.11)
RDW: 13.5 % (ref 11.5–15.5)
WBC: 18.6 10*3/uL — ABNORMAL HIGH (ref 4.0–10.5)
nRBC: 0 % (ref 0.0–0.2)

## 2021-09-07 LAB — GLUCOSE, CAPILLARY
Glucose-Capillary: 111 mg/dL — ABNORMAL HIGH (ref 70–99)
Glucose-Capillary: 255 mg/dL — ABNORMAL HIGH (ref 70–99)
Glucose-Capillary: 124 mg/dL — ABNORMAL HIGH (ref 70–99)
Glucose-Capillary: 116 mg/dL — ABNORMAL HIGH (ref 70–99)
Glucose-Capillary: 111 mg/dL — ABNORMAL HIGH (ref 70–99)

## 2021-09-07 LAB — BASIC METABOLIC PANEL
Anion gap: 10 (ref 5–15)
BUN: 64 mg/dL — ABNORMAL HIGH (ref 8–23)
CO2: 22 mmol/L (ref 22–32)
Calcium: 7.8 mg/dL — ABNORMAL LOW (ref 8.9–10.3)
Chloride: 103 mmol/L (ref 98–111)
Creatinine, Ser: 1.48 mg/dL — ABNORMAL HIGH (ref 0.44–1.00)
GFR, Estimated: 34 mL/min — ABNORMAL LOW (ref >60)
Glucose, Bld: 251 mg/dL — ABNORMAL HIGH (ref 70–99)
Potassium: 4.4 mmol/L (ref 3.5–5.1)
Sodium: 135 mmol/L (ref 135–145)

## 2021-09-07 MED ORDER — SODIUM CHLORIDE 0.9 % IV SOLN: INTRAVENOUS | Status: DC

## 2021-09-07 NOTE — Progress Notes (Signed)
[facility] Progress Note  1 Day Post-Op  Subjective: CC:  Patient awake and alert. Denies abd pain. Mild throat discomfort from NGT. Walked to bathroom overnight. Her son is at bedside.   Objective: Vital signs in last 24 hours: Temp:  [97.2 F (36.2 C)-99.2 F (37.3 C)] 97.2 F (36.2 C) (07/05 0300) Pulse Rate:  [78-97] 97 (07/05 0100) Resp:  [12-28] 16 (07/05 0100) BP: (90-153)/(42-82) 108/47 (07/05 0100) SpO2:  [91 %-100 %] 99 % (07/05 0100) Weight:  [45.5 kg-48.1 kg] 45.5 kg (07/04 1628) Last BM Date : 09/07/21  Intake/Output from previous day: 07/04 0701 - 07/05 0700 In: 3684.6 [I.V.:1500; IV Piggyback:2184.6] Out: 1155 [Urine:850; Drains:205; Blood:100] Intake/Output this shift: No intake/output data recorded.  PE: Gen:  Alert, NAD, pleasant Card:  Regular rate and rhythm, no lower extremity edema.  Pulm:  Normal effort Abd: Soft, appropriately tender around incision, honeycomb c/d/I, end colostomy with viable stoma - soft semisolid brown stool in pouch. JP SS. Skin: warm and dry, no rashes  Psych: A&Ox3   Lab Results:  Recent Labs    09/06/21 1025 09/07/21 0246  WBC 23.4* 18.6*  HGB 11.8* 10.7*  HCT 35.8* 32.7*  PLT 331 277   BMET Recent Labs    09/06/21 1025 09/07/21 0246  NA 131* 135  K 4.2 4.4  CL 94* 103  CO2 22 22  GLUCOSE 220* 251*  BUN 75* 64*  CREATININE 1.98* 1.48*  CALCIUM 9.5 7.8*   PT/INR Recent Labs    09/06/21 1130  LABPROT 16.6*  INR 1.4*   CMP     Component Value Date/Time   NA 135 09/07/2021 0246   K 4.4 09/07/2021 0246   CL 103 09/07/2021 0246   CO2 22 09/07/2021 0246   GLUCOSE 251 (H) 09/07/2021 0246   BUN 64 (H) 09/07/2021 0246   CREATININE 1.48 (H) 09/07/2021 0246   CALCIUM 7.8 (L) 09/07/2021 0246   PROT 7.0 09/06/2021 1025   ALBUMIN 3.4 (L) 09/06/2021 1025   AST 14 (L) 09/06/2021 1025   ALT 14 09/06/2021 1025   ALKPHOS 65 09/06/2021 1025   BILITOT 0.5 09/06/2021 1025   GFRNONAA 34 (L)  09/07/2021 0246   GFRAA  07/06/2009 0420    >60        The eGFR has been calculated using the MDRD equation. This calculation has not been validated in all clinical situations. eGFR's persistently <60 mL/min signify possible Chronic Kidney Disease.   Lipase     Component Value Date/Time   LIPASE 13 09/06/2021 1025       Studies/Results: DG Abd 1 View  Result Date: 09/07/2021 CLINICAL DATA:  NG tube placement. EXAM: ABDOMEN - 1 VIEW COMPARISON:  CT yesterday. FINDINGS: Tip and side port of the enteric tube below the diaphragm in the stomach. Moderate stool in the colon. IMPRESSION: Tip and side port of the enteric tube below the diaphragm in the stomach. Electronically Signed   By: Keith Rake M.D.   On: 09/07/2021 01:50   CT ABDOMEN PELVIS WO CONTRAST  Result Date: 09/06/2021 CLINICAL DATA:  Acute abdominal pain. Mid abdominal pain for 3 days. EXAM: CT ABDOMEN AND PELVIS WITHOUT CONTRAST TECHNIQUE: Multidetector CT imaging of the abdomen and pelvis was performed following the standard protocol without IV contrast. RADIATION DOSE REDUCTION: This exam was performed according to the departmental dose-optimization program which includes automated exposure control, adjustment of the mA and/or kV according to patient size and/or use of iterative reconstruction technique. COMPARISON:  CT abdomen dated 09/23/2018. FINDINGS: Lower chest: No acute abnormality. Hepatobiliary: No focal liver abnormality is seen. Gallbladder is unremarkable. No bile duct dilatation seen. Pancreas: Unremarkable. No pancreatic ductal dilatation or surrounding inflammatory changes. Spleen: Normal in size without focal abnormality. Adrenals/Urinary Tract: Adrenal glands appear normal. Moderate RIGHT-sided hydronephrosis. LEFT kidney is unremarkable without stone or hydronephrosis. No RIGHT-sided ureteral stone identified. There is a complex collection of soft tissue thickening and gas within the RIGHT hemipelvis, with  surrounding inflammation/fluid/phlegmon, which appears to be causing the hydronephrosis. Stomach/Bowel: Extensive soft tissue thickening and inflammatory change involving the small bowel of the RIGHT upper pelvis and abutting sigmoid colon (axial series 2, images 50 through 60; coronal series 5, images 35 through 48). There is tortuosity and narrowing of the small bowel in the RIGHT lower quadrant leading into this area of inflammation, raising the possibility of malrotation/volvulus or internal hernia with associated obstruction and/or ischemia. There is oral contrast within the sigmoid colon and within the inflamed-appearing small bowel loops overlying the sigmoid colon, also suspicious for enterocolic fistula. As above, this soft tissue thickening/inflammation and bowel wall thickening/inflammation is resulting in the moderate RIGHT-sided hydronephrosis. Extensive diverticulosis of the sigmoid colon in the LEFT pelvis. Distention of the transverse colon and LEFT colon, filled with stool suggesting some degree of associated colonic obstruction. Mild prominence of the small bowel in the RIGHT lower quadrant, also suggesting a partial obstruction. Stomach is unremarkable. Vascular/Lymphatic: Extensive aortic atherosclerosis. Reproductive: Presumed hysterectomy.  No adnexal mass identified. Other: No free intraperitoneal air identified. Musculoskeletal: Degenerative spondylosis of the slightly scoliotic thoracolumbar spine. No acute-appearing osseous abnormality. IMPRESSION: 1. Marked thickening and inflammatory change involving the small bowel of the RIGHT upper pelvis and the adjacent/underlying sigmoid colon. There is tortuosity and narrowing of the small bowel in this area of inflammation, and tortuosity of the small bowel and mesentery of the overlying RIGHT lower quadrant, raising the possibility of malrotation/volvulus or internal hernia with resultant small bowel ischemia. 2. Additionally, there is oral  contrast both within the abnormal-appearing sigmoid colon and within the inflamed-appearing small bowel loops abutting the sigmoid colon, suspicious for associated enterocolic fistula. 3. Distention of the transverse colon and LEFT colon, filled with stool, suggesting some degree of associated colonic obstruction. 4. Mildly dilated small bowel within the RIGHT lower quadrant, also suggesting partial obstruction. 5. Extensive diverticulosis of the sigmoid colon in the LEFT pelvis. Aortic Atherosclerosis (ICD10-I70.0). If additional imaging characterization were needed, would consider repeat CT abdomen and pelvis with oral and rectal contrast. These results and recommendations were called by telephone at the time of interpretation on 09/06/2021 at 12:30 pm to provider [facility] RAY , who verbally acknowledged these results. Electronically Signed   By: Franki Cabot M.D.   On: 09/06/2021 12:40   DG Chest Port 1 View  Result Date: 09/06/2021 CLINICAL DATA:  Fatigue and diarrhea.  Questionable sepsis. EXAM: PORTABLE CHEST 1 VIEW COMPARISON:  03/09/2015 FINDINGS: The lungs are clear without focal pneumonia, edema, pneumothorax or pleural effusion. The cardiopericardial silhouette is within normal limits for size. The visualized bony structures of the thorax are unremarkable. Telemetry leads overlie the chest. IMPRESSION: No active disease. Electronically Signed   By: Misty Stanley M.D.   On: 09/06/2021 12:08    Anti-infectives: Anti-infectives (From admission, onward)    Start     Dose/Rate Route Frequency Ordered Stop   09/07/21 1300  ceFEPIme (MAXIPIME) 2 g in sodium chloride 0.9 % 100 mL IVPB  Status:  Discontinued  2 g 200 mL/hr over 30 Minutes Intravenous Every 24 hours 09/06/21 1307 09/06/21 1943   09/06/21 2200  piperacillin-tazobactam (ZOSYN) IVPB 2.25 g        2.25 g 100 mL/hr over 30 Minutes Intravenous Every 8 hours 09/06/21 1943     09/06/21 1230  ceFEPIme (MAXIPIME) 2 g in sodium chloride  0.9 % 100 mL IVPB        2 g 200 mL/hr over 30 Minutes Intravenous  Once 09/06/21 1229 09/06/21 1404   09/06/21 1230  metroNIDAZOLE (FLAGYL) IVPB 500 mg        500 mg 100 mL/hr over 60 Minutes Intravenous  Once 09/06/21 1229 09/06/21 1537        Assessment/Plan Infarcted sigmoid colon  S/p exploratory laparotomy, sigmoid colectomy, end descending colostomy 09/06/21 Dr. Harlow Asa -  await path, per Dr. Harlow Asa suspect due to diverticular disease  - POD#1, afebrile, VSS, WBC 18 from 23 - Hgb stable 10.7 from 11.8, creatinine improving 1.48 (1.98)  - continue NG to LIWS and monitor, await further bowel function - WOC RN consult for new ostomy - OOB, PT/OT eval  - will keep foley today for strict I&Os, monitoring of renal function.   FEN: NPO, IVF, ice chips ok, NG to LIWS ID: Zosyn 7/4 >> continue for 5 days post-op.  Foley: continue VTE: SCD's, SQH Dispo: SDU   AKI - IVF per primary team Hypothyroidism HTN   LOS: 1 day    Obie Dredge, [facility] Please see Amion for pager number during day hours 7:00am-4:30pm

## 2021-09-07 NOTE — Progress Notes (Signed)
PROGRESS NOTE  Jenna Thomas VVO:[id number] DOB: 12-Apr-1931 DOA: 09/06/2021 PCP: Sueanne Margarita, DO   LOS: 1 day   Brief Narrative / Interim history: 88 y.o. female with medical history significant of hypothyroidism, HTN, came with worsening of abdominal pain.  CT scan of the abdomen pelvis on admission showed volvulus/malrotation, chronic obstruction versus ischemia, right-sided hydronephrosis.  General surgery consulted, she was taken urgently to the OR on 7/4 and was found to have infarcted sigmoid colon, status post sigmoid colectomy and end descending colostomy.  Subjective / 24h Interval events: Doing well this morning.  She has no complaints.  Patient appears confused.  Son is at bedside  Assesement and Plan: Principal Problem:   Bowel obstruction (HCC) Active Problems:   AKI (acute kidney injury) ([place])   Principal problem Infarcted sigmoid colon-status post ex lap, sigmoid colectomy and end descending colostomy.  Suspect due to diverticular disease.  Appreciate general surgery follow-up.  Continue NG tube, IV fluids.  She has been placed on antibiotics with Zosyn, continue for 5 days postoperatively.  White count still elevated but improving  Active problems Right-sided hydronephrosis-possibly due to dense pelvic adhesions from colonic process.  Status post sigmoid colectomy and end colostomy on 7/4.  Renal function improving  Acute metabolic encephalopathy-confused postoperatively, son who is at bedside tells me that she has been having intermittent memory problems for some time.  Suspect a degree of underlying dementia worsened by this acute hospitalization  AKI on CKD 3A-baseline creatinine around 1.4.  Possibly related to hydronephrosis, received IV fluids, underwent surgery, now renal function has returned to baseline  Essential hypertension-continue to hold home agents  Hypothyroidism-continue Synthroid.  TSH 8.0, hard to interpret in the setting of an acute illness.   Repeat in 3 weeks as an outpatient  Scheduled Meds:  Chlorhexidine Gluconate Cloth  6 each Topical Daily   heparin  5,000 Units Subcutaneous Q12H   insulin aspart  0-9 Units Subcutaneous TID WC   levothyroxine  75 mcg Oral QAC breakfast   mouth rinse  15 mL Mouth Rinse 4 times per day   pantoprazole (PROTONIX) IV  40 mg Intravenous QHS   Continuous Infusions:  sodium chloride     sodium chloride 75 mL/hr at 09/07/21 0900   piperacillin-tazobactam (ZOSYN)  IV Stopped (09/07/21 1042)   PRN Meds:.sodium chloride, acetaminophen **OR** acetaminophen, hydrALAZINE, HYDROmorphone (DILAUDID) injection, ondansetron **OR** ondansetron (ZOFRAN) IV, mouth rinse  Diet Orders (From admission, onward)     Start     Ordered   09/06/21 1918  Diet NPO time specified  Diet effective now        09/06/21 1917            DVT prophylaxis: SCD's Start: 09/06/21 2348 heparin injection 5,000 Units Start: 09/06/21 2200   Lab Results  Component Value Date   PLT 277 09/07/2021      Code Status: Full Code  Family Communication: son present at bedside  Status is: Inpatient  Remains inpatient appropriate because: Postop day 1  Level of care: Stepdown  Consultants:  General surgery   Objective: Vitals:   09/07/21 0300 09/07/21 0800 09/07/21 0900 09/07/21 1000  BP:  (!) 124/57 100/67 (!) 105/49  Pulse:  83 77 82  Resp:  (!) 22 17 (!) 22  Temp: (!) 97.2 F (36.2 C)     TempSrc: Oral     SpO2:  100% 97% 94%  Weight:      Height:  Intake/Output Summary (Last 24 hours) at 09/07/2021 1222 Last data filed at 09/07/2021 0900 Gross per 24 hour  Intake 4360.53 ml  Output 1155 ml  Net 3205.53 ml   Wt Readings from Last 3 Encounters:  09/06/21 45.5 kg  12/12/18 46.3 kg  10/24/18 45.8 kg    Examination:  Constitutional: NAD Eyes: no scleral icterus ENMT: Mucous membranes are moist.  Neck: normal, supple Respiratory: clear to auscultation bilaterally, no wheezing, no crackles.  Normal respiratory effort. No accessory muscle use.  Cardiovascular: Regular rate and rhythm, no murmurs / rubs / gallops. No LE edema. Good peripheral pulses Abdomen: Deferred Musculoskeletal: no clubbing / cyanosis.  Skin: no rashes Neurologic: non focal    Data Reviewed: I have independently reviewed following labs and imaging studies   CBC Recent Labs  Lab 09/06/21 1025 09/07/21 0246  WBC 23.4* 18.6*  HGB 11.8* 10.7*  HCT 35.8* 32.7*  PLT 331 277  MCV 98.9 100.0  MCH 32.6 32.7  MCHC 33.0 32.7  RDW 13.4 13.5    Recent Labs  Lab 09/06/21 1025 09/06/21 1130 09/06/21 1649 09/06/21 1803 09/07/21 0246  NA 131*  --   --   --  135  K 4.2  --   --   --  4.4  CL 94*  --   --   --  103  CO2 22  --   --   --  22  GLUCOSE 220*  --   --   --  251*  BUN 75*  --   --   --  64*  CREATININE 1.98*  --   --   --  1.48*  CALCIUM 9.5  --   --   --  7.8*  AST 14*  --   --   --   --   ALT 14  --   --   --   --   ALKPHOS 65  --   --   --   --   BILITOT 0.5  --   --   --   --   ALBUMIN 3.4*  --   --   --   --   LATICACIDVEN  --  1.8  --   --   --   INR  --  1.4*  --   --   --   TSH  --   --  8.025*  --   --   HGBA1C  --   --   --  5.9*  --     ------------------------------------------------------------------------------------------------------------------ No results for input(s): "CHOL", "HDL", "LDLCALC", "TRIG", "CHOLHDL", "LDLDIRECT" in the last 72 hours.  Lab Results  Component Value Date   HGBA1C 5.9 (H) 09/06/2021   ------------------------------------------------------------------------------------------------------------------ Recent Labs    09/06/21 1649  TSH 8.025*    Cardiac Enzymes No results for input(s): "CKMB", "TROPONINI", "MYOGLOBIN" in the last 168 hours.  Invalid input(s): "CK" ------------------------------------------------------------------------------------------------------------------ No results found for: "BNP"  CBG: Recent Labs  Lab  09/06/21 1820 09/06/21 2022 09/07/21 0751 09/07/21 1158  GLUCAP 122* 111* 255* 124*    Recent Results (from the past 240 hour(s))  Resp Panel by RT-PCR (Flu A&B, Covid) Anterior Nasal Swab     Status: None   Collection Time: 09/06/21  2:38 PM   Specimen: Anterior Nasal Swab  Result Value Ref Range Status   SARS Coronavirus 2 by RT PCR NEGATIVE NEGATIVE Final    Comment: (NOTE) SARS-CoV-2 target nucleic acids are NOT DETECTED.  The SARS-CoV-2 RNA is  generally detectable in upper respiratory specimens during the acute phase of infection. The lowest concentration of SARS-CoV-2 viral copies this assay can detect is 138 copies/mL. A negative result does not preclude SARS-Cov-2 infection and should not be used as the sole basis for treatment or other patient management decisions. A negative result may occur with  improper specimen collection/handling, submission of specimen other than nasopharyngeal swab, presence of viral mutation(s) within the areas targeted by this assay, and inadequate number of viral copies(<138 copies/mL). A negative result must be combined with clinical observations, patient history, and epidemiological information. The expected result is Negative.  Fact Sheet for Patients:  EntrepreneurPulse.com.au  Fact Sheet for Healthcare Providers:  IncredibleEmployment.be  This test is no t yet approved or cleared by the [place] FDA and  has been authorized for detection and/or diagnosis of SARS-CoV-2 by FDA under an Emergency Use Authorization (EUA). This EUA will remain  in effect (meaning this test can be used) for the duration of the COVID-19 declaration under Section 564(b)(1) of the Act, 21 U.S.C.section 360bbb-3(b)(1), unless the authorization is terminated  or revoked sooner.       Influenza A by PCR NEGATIVE NEGATIVE Final   Influenza B by PCR NEGATIVE NEGATIVE Final    Comment: (NOTE) The Xpert Xpress  SARS-CoV-2/FLU/RSV plus assay is intended as an aid in the diagnosis of influenza from Nasopharyngeal swab specimens and should not be used as a sole basis for treatment. Nasal washings and aspirates are unacceptable for Xpert Xpress SARS-CoV-2/FLU/RSV testing.  Fact Sheet for Patients: EntrepreneurPulse.com.au  Fact Sheet for Healthcare Providers: IncredibleEmployment.be  This test is not yet approved or cleared by the [place] FDA and has been authorized for detection and/or diagnosis of SARS-CoV-2 by FDA under an Emergency Use Authorization (EUA). This EUA will remain in effect (meaning this test can be used) for the duration of the COVID-19 declaration under Section 564(b)(1) of the Act, 21 U.S.C. section 360bbb-3(b)(1), unless the authorization is terminated or revoked.  Performed at [facility], [address] [place], [place] [address]   MRSA Next Gen by PCR, Nasal     Status: None   Collection Time: 09/06/21  4:22 PM   Specimen: Nasal Mucosa; Nasal Swab  Result Value Ref Range Status   MRSA by PCR Next Gen NOT DETECTED NOT DETECTED Final    Comment: (NOTE) The GeneXpert MRSA Assay (FDA approved for NASAL specimens only), is one component of a comprehensive MRSA colonization surveillance program. It is not intended to diagnose MRSA infection nor to guide or monitor treatment for MRSA infections. Test performance is not FDA approved in patients less than 65 years old. Performed at [facility], [place] [address], [place], [place] [address]      Radiology Studies: DG Abd 1 View  Result Date: 09/07/2021 CLINICAL DATA:  NG tube placement. EXAM: ABDOMEN - 1 VIEW COMPARISON:  CT yesterday. FINDINGS: Tip and side port of the enteric tube below the diaphragm in the stomach. Moderate stool in the colon. IMPRESSION: Tip and side port of the enteric tube below the diaphragm in the stomach.  Electronically Signed   By: Keith Rake M.D.   On: 09/07/2021 01:50     Marzetta Board, MD, PhD [facility]  Between 7 am - 7 pm I am available, please contact me via Amion (for emergencies) or Securechat (non urgent messages)  Between 7 pm - 7 am I am not available, please contact night coverage MD/APP via Amion

## 2021-09-07 NOTE — TOC Initial Note (Signed)
Transition of Care [facility]) - Initial/Assessment Note    Patient Details  Name: Jenna Thomas MRN: [id number] Date of Birth: 07-02-1931  [facility] CM/SW Contact:    Leeroy Cha, RN Phone Number: 09/07/2021, 7:21 AM  Clinical Narrative:                 [phone] chart reviewed.  Following for toc needs.  Plan is to return home with self-care at this time.  Expected Discharge Plan: Home/Self Care Barriers to Discharge: Continued Medical Work up   Patient Goals and CMS Choice Patient states their goals for this hospitalization and ongoing recovery are:: to return to my home CMS Medicare.gov Compare Post Acute Care list provided to:: Patient    Expected Discharge Plan and Services Expected Discharge Plan: Home/Self Care   Discharge Planning Services: CM Consult   Living arrangements for the past 2 months: [place]                                      Prior Living Arrangements/Services Living arrangements for the past 2 months: [place] ([place]) Lives with:: Self Patient language and need for interpreter reviewed:: Yes Do you feel safe going back to the place where you live?: Yes            Criminal Activity/Legal Involvement Pertinent to Current Situation/Hospitalization: No - Comment as needed  Activities of Daily Living Home Assistive Devices/Equipment: Walker (specify type) ADL Screening (condition at time of admission) Patient's cognitive ability adequate to safely complete daily activities?: Yes Is the patient deaf or have difficulty hearing?: No Does the patient have difficulty seeing, even when wearing glasses/contacts?: No Does the patient have difficulty concentrating, remembering, or making decisions?: No Patient able to express need for assistance with ADLs?: No Does the patient have difficulty dressing or bathing?: No Independently performs ADLs?: Yes (appropriate for  developmental age) Does the patient have difficulty walking or climbing stairs?: No Weakness of Legs: None Weakness of Arms/Hands: None  Permission Sought/Granted                  Emotional Assessment Appearance:: Appears stated age     Orientation: : Oriented to Self, Oriented to Place, Oriented to  Time, Oriented to Situation Alcohol / Substance Use: Not Applicable Psych Involvement: No (comment)  Admission diagnosis:  Bowel obstruction ([place]) [K56.609] AKI (acute kidney injury) ([place]) [N17.9] Abdominal infection ([place]) [K65.9] Patient Active Problem List   Diagnosis Date Noted   Bowel obstruction ([place]) 09/06/2021   AKI (acute kidney injury) ([place]) 09/06/2021   PCP:  Sueanne Margarita, DO Pharmacy:   [facility], [place] [address] Phone: [phone] Fax: [phone]     Social Determinants of Health (SDOH) Interventions    Readmission Risk Interventions     No data to display

## 2021-09-07 NOTE — Evaluation (Signed)
Physical Therapy Evaluation Patient Details Name: Jenna Thomas MRN: [id number] DOB: 1931-06-08 Today's Date: 09/07/2021  History of Present Illness  88 y.o. female with medical history significant of hypothyroidism, HTN, macular degeneration.  Pt presented with worsening of abdominal pain.  CT scan of the abdomen pelvis on admission showed volvulus/malrotation, chronic obstruction versus ischemia, right-sided hydronephrosis.  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  Clinical Impression  Pt admitted with above diagnosis.  Pt currently with functional limitations due to the deficits listed below (see PT Problem List). Pt will benefit from skilled PT to increase their independence and safety with mobility to allow discharge to the venue listed below.  Pt assisted with ambulating in hallway and requiring min assist for mobility at this time.  Pt from [place] and may need more assist upon d/c.  IF assist not available, pt would benefit from SNF however pt plans to return to her apt.        Recommendations for follow up therapy are one component of a multi-disciplinary discharge planning process, led by the attending physician.  Recommendations may be updated based on patient status, additional functional criteria and insurance authorization.  Follow Up Recommendations Home health PT      Assistance Recommended at Discharge Frequent or constant Supervision/Assistance  Patient can return home with the following  A little help with walking and/or transfers;A little help with bathing/dressing/bathroom;Direct supervision/assist for medications management;Assistance with cooking/housework    Equipment Recommendations None recommended by PT  Recommendations for Other Services       Functional Status Assessment Patient has had a recent decline in their  functional status and demonstrates the ability to make significant improvements in function in a reasonable and predictable amount of time.     Precautions / Restrictions Precautions Precautions: Fall Precaution Comments: NG tube, colostomy, R lower abdomen JP drain      Mobility  Bed Mobility Overal bed mobility: Needs Assistance Bed Mobility: Supine to Sit     Supine to sit: HOB elevated, Min assist, +2 for safety/equipment     General bed mobility comments: assist mostly due to bed and for lines    Transfers Overall transfer level: Needs assistance Equipment used: Rolling walker (2 wheels) Transfers: Sit to/from Stand Sit to Stand: Min assist, +2 safety/equipment           General transfer comment: assist to rise and steady    Ambulation/Gait Ambulation/Gait assistance: +2 safety/equipment, Min assist, Min guard Gait Distance (Feet): 120 Feet Assistive device: Rolling walker (2 wheels) Gait Pattern/deviations: Step-through pattern, Decreased stride length, Trunk flexed Gait velocity: decr     General Gait Details: verbal cues for safe use of RW, SpO2 98% on room air upon return to room, pt denies any symptoms  Stairs            Wheelchair Mobility    Modified Rankin (Stroke Patients Only)       Balance Overall balance assessment: Mild deficits observed, not formally tested                                           Pertinent Vitals/Pain Pain Assessment Pain Assessment: No/denies pain    Home Living Family/patient expects to be discharged to:: Private residence Living Arrangements: Alone  Type of Home: Independent living facility           Home Equipment: Kasandra Knudsen - single point Additional Comments: from [place]    Prior Function Prior Level of Function : Independent/Modified Independent             Mobility Comments: uses SPC       Hand Dominance        Extremity/Trunk Assessment        Lower  Extremity Assessment Lower Extremity Assessment: Generalized weakness       Communication   Communication: No difficulties  Cognition Arousal/Alertness: Awake/alert Behavior During Therapy: WFL for tasks assessed/performed                                   General Comments: Per MD note, "Acute metabolic encephalopathy-confused postoperatively, son who is at bedside tells me that she has been having intermittent memory problems for some time.  Suspect a degree of underlying dementia worsened by this acute hospitalization"        General Comments      Exercises     Assessment/Plan    PT Assessment Patient needs continued PT services  PT Problem List Decreased strength;Decreased activity tolerance;Decreased mobility;Decreased balance;Decreased knowledge of use of DME       PT Treatment Interventions Gait training;DME instruction;Therapeutic exercise;Functional mobility training;Therapeutic activities;Patient/family education;Balance training    PT Goals (Current goals can be found in the Care Plan section)  Acute Rehab PT Goals PT Goal Formulation: With patient Time For Goal Achievement: 09/21/21 Potential to Achieve Goals: Good    Frequency Min 3X/week     Co-evaluation               AM-PAC PT "6 Clicks" Mobility  Outcome Measure Help needed turning from your back to your side while in a flat bed without using bedrails?: A Little Help needed moving from lying on your back to sitting on the side of a flat bed without using bedrails?: A Little Help needed moving to and from a bed to a chair (including a wheelchair)?: A Little Help needed standing up from a chair using your arms (e.g., wheelchair or bedside chair)?: A Lot Help needed to walk in hospital room?: A Lot Help needed climbing 3-5 steps with a railing? : A Lot 6 Click Score: 15    End of Session Equipment Utilized During Treatment: Gait belt Activity Tolerance: Patient tolerated  treatment well Patient left: in chair;with call bell/phone within reach;with chair alarm set;with nursing/sitter in room Nurse Communication: Mobility status PT Visit Diagnosis: Difficulty in walking, not elsewhere classified (R26.2)    Time: 6283-1517 PT Time Calculation (min) (ACUTE ONLY): 22 min   Charges:   PT Evaluation $PT Eval Low Complexity: 1 Low        Kati PT, DPT Physical Therapist Acute Rehabilitation Services Preferred contact method: Secure Chat Weekend Pager Only: [phone] Office: [phone]   Myrtis Hopping [place] 09/07/2021, 3:41 PM

## 2021-09-07 NOTE — Anesthesia Postprocedure Evaluation (Signed)
Anesthesia Post Note  Patient: Jenna Thomas  Procedure(s) Performed: EXPLORATORY LAPAROTOMY, SIGMOID COLECTOMY, END COLOSTOMY (Abdomen)     Patient location during evaluation: PACU Anesthesia Type: General Level of consciousness: awake and alert Pain management: pain level controlled Vital Signs Assessment: post-procedure vital signs reviewed and stable Respiratory status: spontaneous breathing, nonlabored ventilation, respiratory function stable and patient connected to nasal cannula oxygen Cardiovascular status: blood pressure returned to baseline and stable Postop Assessment: no apparent nausea or vomiting Anesthetic complications: no   No notable events documented.  Last Vitals:  Vitals:   09/06/21 2315 09/06/21 2330  BP: (!) 109/58 (!) 118/46  Pulse: 92 95  Resp: (!) 24 (!) 21  Temp:    SpO2: 93% 93%    Last Pain:  Vitals:   09/06/21 2330  TempSrc:   PainSc: 0-No pain                 Effie Berkshire

## 2021-09-07 NOTE — Consult Note (Signed)
Urology Consult   Physician requesting consult: Wynetta Fines, MD   Reason for consult: Right hydronephrosis  History of Present Illness: Jenna Thomas is a 88 y.o. female who was admitted yesterday due to a 3-day history of abdominal pain.  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  She underwent an urgent X lap with Dr. Harlow Asa and was found to have a sigmoid colon mass and is status post sigmoid colectomy with end colostomy.  Urology was consulted after she was found to have right-sided hydronephrosis on her CT.  Her initial serum creatinine was found to be 1.98 and has since improved to 1.48 following IV fluid resuscitation and surgery.  She currently denies any significant right-sided flank pain.  The patient denies a history of voiding or storage urinary symptoms, hematuria, UTIs, STDs, urolithiasis, GU malignancy/trauma/surgery.  Past Medical History:  Diagnosis Date   Arthritis    back   Endometrial polyp    Hypertension    Hypothyroidism    Macular degeneration of both eyes    Osteopenia    PMB (postmenopausal bleeding)    Wears glasses     Past Surgical History:  Procedure Laterality Date   CATARACT EXTRACTION W/ INTRAOCULAR LENS  IMPLANT, BILATERAL  2012   COLONOSCOPY  last one 2014   [place] N/A 10/24/2018   Procedure: [place];  Surgeon: Molli Posey, MD;  Location: [facility];  Service: Gynecology;  Laterality: N/A;   LAPAROTOMY N/A 09/06/2021   Procedure: EXPLORATORY LAPAROTOMY, SIGMOID COLECTOMY, END COLOSTOMY;  Surgeon: Armandina Gemma, MD;  Location: [facility];  Service: General;  Laterality: N/A;   TOTAL HIP ARTHROPLASTY Bilateral left 12-18-2005;  right 07-04-2009  dr Alvan Dame '@[facility]     Current Hospital Medications:  Home Meds:  Current Meds  Medication Sig   bismuth subsalicylate (KAOPECTATE) 262 MG/15ML  suspension Take 30 mLs by mouth every 6 (six) hours as needed for indigestion.   bismuth subsalicylate (PEPTO BISMOL) 262 MG/15ML suspension Take 15 mLs by mouth every 6 (six) hours as needed for indigestion.   Calcium Carb-Cholecalciferol (CALCIUM 600 + D PO) Take 1 tablet by mouth 2 (two) times daily.   levothyroxine (SYNTHROID, LEVOTHROID) 75 MCG tablet Take 75 mcg by mouth daily before breakfast.    losartan-hydrochlorothiazide (HYZAAR) 100-25 MG per tablet Take 1 tablet by mouth daily.    Multiple Vitamins-Minerals (PRESERVISION AREDS 2) CAPS Take 1 capsule by mouth 2 (two) times daily.   spironolactone (ALDACTONE) 25 MG tablet Take 25 mg by mouth daily.    Scheduled Meds:  Chlorhexidine Gluconate Cloth  6 each Topical Daily   heparin  5,000 Units Subcutaneous Q12H   insulin aspart  0-9 Units Subcutaneous TID WC   levothyroxine  75 mcg Oral QAC breakfast   mouth rinse  15 mL Mouth Rinse 4 times per day   pantoprazole (PROTONIX) IV  40 mg Intravenous QHS   Continuous Infusions:  sodium chloride     sodium chloride 75 mL/hr at 09/07/21 0808   piperacillin-tazobactam (ZOSYN)  IV Stopped (09/07/21 0140)   PRN Meds:.sodium chloride, acetaminophen **OR** acetaminophen, hydrALAZINE, HYDROmorphone (DILAUDID) injection, ondansetron **OR** ondansetron (ZOFRAN) IV, mouth rinse  Allergies:  Allergies  Allergen Reactions   Sulfa Antibiotics Other (See Comments)    Does not remember reaction    Family History  Problem Relation Age of Onset   Heart disease Mother    Heart disease Father    Bladder  Cancer Brother        former smoker   Colon cancer Neg Hx    Esophageal cancer Neg Hx    Rectal cancer Neg Hx    Stomach cancer Neg Hx    Breast cancer Neg Hx     Social History:  reports that she quit smoking about 26 years ago. Her smoking use included cigarettes. She has never used smokeless tobacco. She reports current alcohol use of about 3.0 standard drinks of alcohol per week. She  reports that she does not use drugs.  ROS: A complete review of systems was performed.  All systems are negative except for pertinent findings as noted.  Physical Exam:  Vital signs in last 24 hours: Temp:  [97.2 F (36.2 C)-99.2 F (37.3 C)] 97.2 F (36.2 C) (07/05 0300) Pulse Rate:  [78-97] 83 (07/05 0800) Resp:  [12-28] 22 (07/05 0800) BP: (90-153)/(42-82) 124/57 (07/05 0800) SpO2:  [91 %-100 %] 100 % (07/05 0800) Weight:  [45.5 kg-48.1 kg] 45.5 kg (07/04 1628) Constitutional:  Alert and oriented, No acute distress Psychiatric: Normal mood and affect  Laboratory Data:  Recent Labs    09/06/21 1025 09/07/21 0246  WBC 23.4* 18.6*  HGB 11.8* 10.7*  HCT 35.8* 32.7*  PLT 331 277    Recent Labs    09/06/21 1025 09/07/21 0246  NA 131* 135  K 4.2 4.4  CL 94* 103  GLUCOSE 220* 251*  BUN 75* 64*  CALCIUM 9.5 7.8*  CREATININE 1.98* 1.48*     Results for orders placed or performed during the hospital encounter of 09/06/21 (from the past 24 hour(s))  Lipase, blood     Status: None   Collection Time: 09/06/21 10:25 AM  Result Value Ref Range   Lipase 13 11 - 51 U/L  Comprehensive metabolic panel     Status: Abnormal   Collection Time: 09/06/21 10:25 AM  Result Value Ref Range   Sodium 131 (L) 135 - 145 mmol/L   Potassium 4.2 3.5 - 5.1 mmol/L   Chloride 94 (L) 98 - 111 mmol/L   CO2 22 22 - 32 mmol/L   Glucose, Bld 220 (H) 70 - 99 mg/dL   BUN 75 (H) 8 - 23 mg/dL   Creatinine, Ser 1.98 (H) 0.44 - 1.00 mg/dL   Calcium 9.5 8.9 - 10.3 mg/dL   Total Protein 7.0 6.5 - 8.1 g/dL   Albumin 3.4 (L) 3.5 - 5.0 g/dL   AST 14 (L) 15 - 41 U/L   ALT 14 0 - 44 U/L   Alkaline Phosphatase 65 38 - 126 U/L   Total Bilirubin 0.5 0.3 - 1.2 mg/dL   GFR, Estimated 24 (L) >60 mL/min   Anion gap 15 5 - 15  CBC     Status: Abnormal   Collection Time: 09/06/21 10:25 AM  Result Value Ref Range   WBC 23.4 (H) 4.0 - 10.5 K/uL   RBC 3.62 (L) 3.87 - 5.11 MIL/uL   Hemoglobin 11.8 (L) 12.0 -  15.0 g/dL   HCT 35.8 (L) 36.0 - 46.0 %   MCV 98.9 80.0 - 100.0 fL   MCH 32.6 26.0 - 34.0 pg   MCHC 33.0 30.0 - 36.0 g/dL   RDW 13.4 11.5 - 15.5 %   Platelets 331 150 - 400 K/uL   nRBC 0.0 0.0 - 0.2 %  Lactic acid, plasma     Status: None   Collection Time: 09/06/21 11:30 AM  Result Value Ref Range   Lactic  Acid, Venous 1.8 0.5 - 1.9 mmol/L  Protime-INR     Status: Abnormal   Collection Time: 09/06/21 11:30 AM  Result Value Ref Range   Prothrombin Time 16.6 (H) 11.4 - 15.2 seconds   INR 1.4 (H) 0.8 - 1.2  APTT     Status: None   Collection Time: 09/06/21 11:30 AM  Result Value Ref Range   aPTT 28 24 - 36 seconds  Resp Panel by RT-PCR (Flu A&B, Covid) Anterior Nasal Swab     Status: None   Collection Time: 09/06/21  2:38 PM   Specimen: Anterior Nasal Swab  Result Value Ref Range   SARS Coronavirus 2 by RT PCR NEGATIVE NEGATIVE   Influenza A by PCR NEGATIVE NEGATIVE   Influenza B by PCR NEGATIVE NEGATIVE  MRSA Next Gen by PCR, Nasal     Status: None   Collection Time: 09/06/21  4:22 PM   Specimen: Nasal Mucosa; Nasal Swab  Result Value Ref Range   MRSA by PCR Next Gen NOT DETECTED NOT DETECTED  TSH     Status: Abnormal   Collection Time: 09/06/21  4:49 PM  Result Value Ref Range   TSH 8.025 (H) 0.350 - 4.500 uIU/mL  Hemoglobin A1c     Status: Abnormal   Collection Time: 09/06/21  6:03 PM  Result Value Ref Range   Hgb A1c MFr Bld 5.9 (H) 4.8 - 5.6 %   Mean Plasma Glucose 122.63 mg/dL  Glucose, capillary     Status: Abnormal   Collection Time: 09/06/21  6:20 PM  Result Value Ref Range   Glucose-Capillary 122 (H) 70 - 99 mg/dL   Comment 1 Notify RN    Comment 2 Document in Chart   Glucose, capillary     Status: Abnormal   Collection Time: 09/06/21  8:22 PM  Result Value Ref Range   Glucose-Capillary 111 (H) 70 - 99 mg/dL  CBC     Status: Abnormal   Collection Time: 09/07/21  2:46 AM  Result Value Ref Range   WBC 18.6 (H) 4.0 - 10.5 K/uL   RBC 3.27 (L) 3.87 - 5.11  MIL/uL   Hemoglobin 10.7 (L) 12.0 - 15.0 g/dL   HCT 32.7 (L) 36.0 - 46.0 %   MCV 100.0 80.0 - 100.0 fL   MCH 32.7 26.0 - 34.0 pg   MCHC 32.7 30.0 - 36.0 g/dL   RDW 13.5 11.5 - 15.5 %   Platelets 277 150 - 400 K/uL   nRBC 0.0 0.0 - 0.2 %  Basic metabolic panel     Status: Abnormal   Collection Time: 09/07/21  2:46 AM  Result Value Ref Range   Sodium 135 135 - 145 mmol/L   Potassium 4.4 3.5 - 5.1 mmol/L   Chloride 103 98 - 111 mmol/L   CO2 22 22 - 32 mmol/L   Glucose, Bld 251 (H) 70 - 99 mg/dL   BUN 64 (H) 8 - 23 mg/dL   Creatinine, Ser 1.48 (H) 0.44 - 1.00 mg/dL   Calcium 7.8 (L) 8.9 - 10.3 mg/dL   GFR, Estimated 34 (L) >60 mL/min   Anion gap 10 5 - 15  Glucose, capillary     Status: Abnormal   Collection Time: 09/07/21  7:51 AM  Result Value Ref Range   Glucose-Capillary 255 (H) 70 - 99 mg/dL   Comment 1 Notify RN    Comment 2 Document in Chart    Recent Results (from the past 240 hour(s))  Resp Panel  by RT-PCR (Flu A&B, Covid) Anterior Nasal Swab     Status: None   Collection Time: 09/06/21  2:38 PM   Specimen: Anterior Nasal Swab  Result Value Ref Range Status   SARS Coronavirus 2 by RT PCR NEGATIVE NEGATIVE Final    Comment: (NOTE) SARS-CoV-2 target nucleic acids are NOT DETECTED.  The SARS-CoV-2 RNA is generally detectable in upper respiratory specimens during the acute phase of infection. The lowest concentration of SARS-CoV-2 viral copies this assay can detect is 138 copies/mL. A negative result does not preclude SARS-Cov-2 infection and should not be used as the sole basis for treatment or other patient management decisions. A negative result may occur with  improper specimen collection/handling, submission of specimen other than nasopharyngeal swab, presence of viral mutation(s) within the areas targeted by this assay, and inadequate number of viral copies(<138 copies/mL). A negative result must be combined with clinical observations, patient history, and  epidemiological information. The expected result is Negative.  Fact Sheet for Patients:  EntrepreneurPulse.com.au  Fact Sheet for Healthcare Providers:  IncredibleEmployment.be  This test is no t yet approved or cleared by the [place] FDA and  has been authorized for detection and/or diagnosis of SARS-CoV-2 by FDA under an Emergency Use Authorization (EUA). This EUA will remain  in effect (meaning this test can be used) for the duration of the COVID-19 declaration under Section 564(b)(1) of the Act, 21 U.S.C.section 360bbb-3(b)(1), unless the authorization is terminated  or revoked sooner.       Influenza A by PCR NEGATIVE NEGATIVE Final   Influenza B by PCR NEGATIVE NEGATIVE Final    Comment: (NOTE) The Xpert Xpress SARS-CoV-2/FLU/RSV plus assay is intended as an aid in the diagnosis of influenza from Nasopharyngeal swab specimens and should not be used as a sole basis for treatment. Nasal washings and aspirates are unacceptable for Xpert Xpress SARS-CoV-2/FLU/RSV testing.  Fact Sheet for Patients: EntrepreneurPulse.com.au  Fact Sheet for Healthcare Providers: IncredibleEmployment.be  This test is not yet approved or cleared by the [place] FDA and has been authorized for detection and/or diagnosis of SARS-CoV-2 by FDA under an Emergency Use Authorization (EUA). This EUA will remain in effect (meaning this test can be used) for the duration of the COVID-19 declaration under Section 564(b)(1) of the Act, 21 U.S.C. section 360bbb-3(b)(1), unless the authorization is terminated or revoked.  Performed at [facility], [address] [place], [place] [address]   MRSA Next Gen by PCR, Nasal     Status: None   Collection Time: 09/06/21  4:22 PM   Specimen: Nasal Mucosa; Nasal Swab  Result Value Ref Range Status   MRSA by PCR Next Gen NOT DETECTED NOT DETECTED Final     Comment: (NOTE) The GeneXpert MRSA Assay (FDA approved for NASAL specimens only), is one component of a comprehensive MRSA colonization surveillance program. It is not intended to diagnose MRSA infection nor to guide or monitor treatment for MRSA infections. Test performance is not FDA approved in patients less than 68 years old. Performed at  [facility], [place] [address] [place], [place] [address]     Renal Function: Recent Labs    09/06/21 1025 09/07/21 0246  CREATININE 1.98* 1.48*   Estimated Creatinine Clearance: 18.9 mL/min (A) (by C-G formula based on SCr of 1.48 mg/dL (H)).  Radiologic Imaging: DG Abd 1 View  Result Date: 09/07/2021 CLINICAL DATA:  NG tube placement. EXAM: ABDOMEN - 1 VIEW COMPARISON:  CT yesterday. FINDINGS: Tip and side  port of the enteric tube below the diaphragm in the stomach. Moderate stool in the colon. IMPRESSION: Tip and side port of the enteric tube below the diaphragm in the stomach. Electronically Signed   By: Keith Rake M.D.   On: 09/07/2021 01:50   CT ABDOMEN PELVIS WO CONTRAST  Result Date: 09/06/2021 CLINICAL DATA:  Acute abdominal pain. Mid abdominal pain for 3 days. EXAM: CT ABDOMEN AND PELVIS WITHOUT CONTRAST TECHNIQUE: Multidetector CT imaging of the abdomen and pelvis was performed following the standard protocol without IV contrast. RADIATION DOSE REDUCTION: This exam was performed according to the departmental dose-optimization program which includes automated exposure control, adjustment of the mA and/or kV according to patient size and/or use of iterative reconstruction technique. COMPARISON:  CT abdomen dated 09/23/2018. FINDINGS: Lower chest: No acute abnormality. Hepatobiliary: No focal liver abnormality is seen. Gallbladder is unremarkable. No bile duct dilatation seen. Pancreas: Unremarkable. No pancreatic ductal dilatation or surrounding inflammatory changes. Spleen: Normal in size without focal abnormality.  Adrenals/Urinary Tract: Adrenal glands appear normal. Moderate RIGHT-sided hydronephrosis. LEFT kidney is unremarkable without stone or hydronephrosis. No RIGHT-sided ureteral stone identified. There is a complex collection of soft tissue thickening and gas within the RIGHT hemipelvis, with surrounding inflammation/fluid/phlegmon, which appears to be causing the hydronephrosis. Stomach/Bowel: Extensive soft tissue thickening and inflammatory change involving the small bowel of the RIGHT upper pelvis and abutting sigmoid colon (axial series 2, images 50 through 60; coronal series 5, images 35 through 48). There is tortuosity and narrowing of the small bowel in the RIGHT lower quadrant leading into this area of inflammation, raising the possibility of malrotation/volvulus or internal hernia with associated obstruction and/or ischemia. There is oral contrast within the sigmoid colon and within the inflamed-appearing small bowel loops overlying the sigmoid colon, also suspicious for enterocolic fistula. As above, this soft tissue thickening/inflammation and bowel wall thickening/inflammation is resulting in the moderate RIGHT-sided hydronephrosis. Extensive diverticulosis of the sigmoid colon in the LEFT pelvis. Distention of the transverse colon and LEFT colon, filled with stool suggesting some degree of associated colonic obstruction. Mild prominence of the small bowel in the RIGHT lower quadrant, also suggesting a partial obstruction. Stomach is unremarkable. Vascular/Lymphatic: Extensive aortic atherosclerosis. Reproductive: Presumed hysterectomy.  No adnexal mass identified. Other: No free intraperitoneal air identified. Musculoskeletal: Degenerative spondylosis of the slightly scoliotic thoracolumbar spine. No acute-appearing osseous abnormality. IMPRESSION: 1. Marked thickening and inflammatory change involving the small bowel of the RIGHT upper pelvis and the adjacent/underlying sigmoid colon. There is  tortuosity and narrowing of the small bowel in this area of inflammation, and tortuosity of the small bowel and mesentery of the overlying RIGHT lower quadrant, raising the possibility of malrotation/volvulus or internal hernia with resultant small bowel ischemia. 2. Additionally, there is oral contrast both within the abnormal-appearing sigmoid colon and within the inflamed-appearing small bowel loops abutting the sigmoid colon, suspicious for associated enterocolic fistula. 3. Distention of the transverse colon and LEFT colon, filled with stool, suggesting some degree of associated colonic obstruction. 4. Mildly dilated small bowel within the RIGHT lower quadrant, also suggesting partial obstruction. 5. Extensive diverticulosis of the sigmoid colon in the LEFT pelvis. Aortic Atherosclerosis (ICD10-I70.0). If additional imaging characterization were needed, would consider repeat CT abdomen and pelvis with oral and rectal contrast. These results and recommendations were called by telephone at the time of interpretation on 09/06/2021 at 12:30 pm to provider [facility] R[facility] , who verbally acknowledged these results. Electronically Signed   By: Roxy Horseman.D.  On: 09/06/2021 12:40   DG Chest Port 1 View  Result Date: 09/06/2021 CLINICAL DATA:  Fatigue and diarrhea.  Questionable sepsis. EXAM: PORTABLE CHEST 1 VIEW COMPARISON:  03/09/2015 FINDINGS: The lungs are clear without focal pneumonia, edema, pneumothorax or pleural effusion. The cardiopericardial silhouette is within normal limits for size. The visualized bony structures of the thorax are unremarkable. Telemetry leads overlie the chest. IMPRESSION: No active disease. Electronically Signed   By: Misty Stanley M.D.   On: 09/06/2021 12:08    I independently reviewed the above imaging studies.  Impression/Recommendation 88 year old female with right-sided hydronephrosis likely secondary to dense pelvic adhesions from a colonic mass.  She is status post ex  lap with sigmoid colectomy and end colostomy on 09/06/2021  -Renal function has significantly improved following surgery.  Continue to monitor BMPs.  If her serum creatinine worsens, recommend repeat renal ultrasound to assure that her hydronephrosis is resolving.  Call back as needed.  Ellison Hughs, MD [facility] 09/07/2021, 8:56 AM

## 2021-09-07 NOTE — Consult Note (Addendum)
[place] Nurse ostomy consult note Stoma type/location: LMQ colostomy, producing brown stool. Pouch intact, honeycomb dressing in place.  Son and additional female family member at bedside.  I explain life with an ostomy and that I would like to perform a pouch change with family present as a second set of eyes.  Patient states the family members live out of town and will not be helping.  Patient is intermittently confused.  NG tube is irritating her and she is removing oxygen, trying to blow her nose.  I explain that the tube feels uncomfortable but blowing will not help.  I stress the importance of leaving oxygen in place as the alarms are signalling low oxygen levels and she allows me to replace the nasal cannula.  Son requests that we not perform pouch change today.  I agree and stress that we try to perform ostomy care a couple of times before discharge.  He is unsure if she will be going to rehab, etc. Once stable.  We will attempt ostomy pouch change tomorrow and son agrees to this.   Stomal assessment/size: unable to assess, stoma is covered with stool.  Peristomal assessment: pouch intact  Treatment options for stomal/peristomal skin: unclear Output  soft brown stool in pouch Ostomy pouching: 2pc.  Education provided :  See above.  Will attempt to change pouch tomorrow.  Patient is confused.  Son prefers to wait. Discharge disposition is unclear.  Written materials left with family at bedside.  Enrolled patient in [place] program: No  not until pouch is changed Will follow.  Domenic Moras MSN, RN, FNP-BC [facility] Wound, Ostomy, Continence Nurse Pager [phone]

## 2021-09-08 DIAGNOSIS — K562 Volvulus: Secondary | ICD-10-CM | POA: Diagnosis not present

## 2021-09-08 DIAGNOSIS — E039 Hypothyroidism, unspecified: Secondary | ICD-10-CM

## 2021-09-08 DIAGNOSIS — N1831 Chronic kidney disease, stage 3a: Secondary | ICD-10-CM

## 2021-09-08 DIAGNOSIS — G9341 Metabolic encephalopathy: Secondary | ICD-10-CM

## 2021-09-08 DIAGNOSIS — I10 Essential (primary) hypertension: Secondary | ICD-10-CM

## 2021-09-08 DIAGNOSIS — N1330 Unspecified hydronephrosis: Secondary | ICD-10-CM

## 2021-09-08 LAB — COMPREHENSIVE METABOLIC PANEL
ALT: 18 U/L (ref 0–44)
AST: 18 U/L (ref 15–41)
Albumin: 2.2 g/dL — ABNORMAL LOW (ref 3.5–5.0)
Alkaline Phosphatase: 60 U/L (ref 38–126)
Anion gap: 9 (ref 5–15)
BUN: 43 mg/dL — ABNORMAL HIGH (ref 8–23)
CO2: 22 mmol/L (ref 22–32)
Calcium: 7.5 mg/dL — ABNORMAL LOW (ref 8.9–10.3)
Chloride: 108 mmol/L (ref 98–111)
Creatinine, Ser: 1.31 mg/dL — ABNORMAL HIGH (ref 0.44–1.00)
GFR, Estimated: 39 mL/min — ABNORMAL LOW (ref >60)
Glucose, Bld: 125 mg/dL — ABNORMAL HIGH (ref 70–99)
Potassium: 4.1 mmol/L (ref 3.5–5.1)
Sodium: 139 mmol/L (ref 135–145)
Total Bilirubin: 0.9 mg/dL (ref 0.3–1.2)
Total Protein: 5.8 g/dL — ABNORMAL LOW (ref 6.5–8.1)

## 2021-09-08 LAB — GLUCOSE, CAPILLARY
Glucose-Capillary: 115 mg/dL — ABNORMAL HIGH (ref 70–99)
Glucose-Capillary: 116 mg/dL — ABNORMAL HIGH (ref 70–99)
Glucose-Capillary: 104 mg/dL — ABNORMAL HIGH (ref 70–99)
Glucose-Capillary: 100 mg/dL — ABNORMAL HIGH (ref 70–99)

## 2021-09-08 LAB — CBC
HCT: 34.5 % — ABNORMAL LOW (ref 36.0–46.0)
Hemoglobin: 11.3 g/dL — ABNORMAL LOW (ref 12.0–15.0)
MCH: 32.6 pg (ref 26.0–34.0)
MCHC: 32.8 g/dL (ref 30.0–36.0)
MCV: 99.4 fL (ref 80.0–100.0)
Platelets: 323 10*3/uL (ref 150–400)
RBC: 3.47 MIL/uL — ABNORMAL LOW (ref 3.87–5.11)
RDW: 13.8 % (ref 11.5–15.5)
WBC: 18 10*3/uL — ABNORMAL HIGH (ref 4.0–10.5)
nRBC: 0 % (ref 0.0–0.2)

## 2021-09-08 MED ORDER — PIPERACILLIN-TAZOBACTAM 3.375 G IVPB
3.3750 g | Freq: Three times a day (TID) | INTRAVENOUS | Status: DC
  Administered 2021-09-08 – 2021-09-13 (×14): 3.375 g via INTRAVENOUS
  Filled 2021-09-08 (×14): qty 50

## 2021-09-08 NOTE — Evaluation (Signed)
Occupational Therapy Evaluation Patient Details Name: Jenna Thomas MRN: [id number] DOB: [birth year] Today's Date: 09/08/2021   History of Present Illness 88 y.o. female with medical history significant of hypothyroidism, HTN, macular degeneration.  Pt presented with worsening of abdominal pain.  CT scan of the abdomen pelvis on admission showed volvulus/malrotation, chronic obstruction versus ischemia, right-sided hydronephrosis.  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   Clinical Impression   Mrs. Jenna Thomas is an 88 year old man s/p abdominal surgery and above medical history with above medical history and presents with generalized weakness, decreased activity tolerance, impaired balance and intermittent pain. Patient needing min assist to ambulate with walker and increased assistance with ADLs. Patient will benefit from skilled OT services while in hospital to improve deficits and learn compensatory strategies as needed in order to return to PLOF.  Do not expect she will have OT needs at discharge as long as she continues to progress. Will need intermittent assistance however and daughter reports can hire caregiver.         Recommendations for follow up therapy are one component of a multi-disciplinary discharge planning process, led by the attending physician.  Recommendations may be updated based on patient status, additional functional criteria and insurance authorization.   Follow Up Recommendations  No OT follow up    Assistance Recommended at Discharge Intermittent Supervision/Assistance  Patient can return home with the following A little help with bathing/dressing/bathroom;Assistance with cooking/housework    Functional Status Assessment  Patient has had a recent decline in their functional status and demonstrates the ability to make  significant improvements in function in a reasonable and predictable amount of time.  Equipment Recommendations  None recommended by OT    Recommendations for Other Services       Precautions / Restrictions Precautions Precautions: Fall Precaution Comments: NG tube, colostomy, R lower abdomen JP drain Restrictions Weight Bearing Restrictions: No      Mobility Bed Mobility Overal bed mobility: Needs Assistance Bed Mobility: Supine to Sit     Supine to sit: Supervision, HOB elevated          Transfers Overall transfer level: Needs assistance Equipment used: Rolling walker (2 wheels) Transfers: Sit to/from Stand Sit to Stand: Min assist           General transfer comment: Min assist for steadying with walker to sink and then back to recliner.      Balance Overall balance assessment: Mild deficits observed, not formally tested                                         ADL either performed or assessed with clinical judgement   ADL Overall ADL's : Needs assistance/impaired Eating/Feeding: Independent   Grooming: Standing;Brushing hair Grooming Details (indicate cue type and reason): at sink Upper Body Bathing: Set up;Sitting   Lower Body Bathing: Moderate assistance;Sitting/lateral leans   Upper Body Dressing : Set up;Sitting   Lower Body Dressing: Moderate assistance;Sit to/from stand   Toilet Transfer: Minimal assistance;Rolling walker (2 wheels);BSC/3in1   Toileting- Clothing Manipulation and Hygiene: Minimal assistance;Sit to/from stand       Functional mobility during ADLs: Minimal assistance;Rolling walker (2 wheels)       Vision   Vision Assessment?: No apparent visual deficits  Perception     Praxis      Pertinent Vitals/Pain Pain Assessment Pain Assessment: No/denies pain     Hand Dominance     Extremity/Trunk Assessment Upper Extremity Assessment Upper Extremity Assessment: Overall WFL for tasks assessed    Lower Extremity Assessment Lower Extremity Assessment: Defer to PT evaluation   Cervical / Trunk Assessment Cervical / Trunk Assessment: Normal   Communication Communication Communication: No difficulties   Cognition Arousal/Alertness: Awake/alert Behavior During Therapy: WFL for tasks assessed/performed Overall Cognitive Status: Within Functional Limits for tasks assessed                                       General Comments       Exercises     Shoulder Instructions      Home Living Family/patient expects to be discharged to:: Private residence Living Arrangements: Alone   Type of Home: Independent living facility       Home Layout: One level     Bathroom Shower/Tub: Walk-in shower         Home Equipment: [place] - single point;Grab bars - toilet;Grab bars - tub/shower;Rolling Walker (2 wheels)   Additional Comments: from [employer] [facility]      Prior Functioning/Environment Prior Level of Function : Independent/Modified Independent             Mobility Comments: uses SPC ADLs Comments: independnet        OT Problem List: Decreased strength;Decreased activity tolerance;Impaired balance (sitting and/or standing);Pain;Decreased knowledge of use of DME or AE;Decreased knowledge of precautions      OT Treatment/Interventions: Self-care/ADL training;DME and/or AE instruction;Therapeutic activities;Balance training;Patient/family education    OT Goals(Current goals can be found in the care plan section) Acute Rehab OT Goals Patient Stated Goal: return to independence Time For Goal Achievement: 09/22/21 Potential to Achieve Goals: Good  OT Frequency: Min 2X/week    Co-evaluation              AM-PAC OT "6 Clicks" Daily Activity     Outcome Measure Help from another person eating meals?: None Help from another person taking care of personal grooming?: A Little Help from another person toileting, which includes using toliet, bedpan, or  urinal?: A Little Help from another person bathing (including washing, rinsing, drying)?: A Lot Help from another person to put on and taking off regular upper body clothing?: A Little Help from another person to put on and taking off regular lower body clothing?: A Lot 6 Click Score: 17   End of Session Equipment Utilized During Treatment: Rolling walker (2 wheels) Nurse Communication: Mobility status  Activity Tolerance: Patient tolerated treatment well Patient left: in chair;with call bell/phone within reach;with family/visitor present  OT Visit Diagnosis: Muscle weakness (generalized) (M62.81)                Time: 1497-0263 OT Time Calculation (min): 24 min Charges:  OT General Charges $OT Visit: 1 Visit OT Evaluation $OT Eval Low Complexity: 1 Low OT Treatments $Self Care/Home Management : 8-22 mins  Zaakirah Kistner, OTR/L [place]  Office [phone] Pager: [phone]   Lenward Chancellor 09/08/2021, 4:06 PM

## 2021-09-08 NOTE — Consult Note (Signed)
[facility] Nurse ostomy follow up Patient receiving care in [facility] ICU 1223. Son Jenna Thomas and his wife present for ostomy teaching session. Stoma type/location: LUQ colostomy Stomal assessment/size: 1 3/8 inches, round, moist, sutures intact Peristomal assessment: intact Treatment options for stomal/peristomal skin: barrier ring Output: soft brown stool  Ostomy pouching: 2pc. Keep equal numbers of the following ostomy supplies at the bedside: Pouch, [place] 234; skin barrier [name] [phone]; barrier rings [name] [phone] Education provided: Patient, her son and daughter in law shown the entire pouching emptying, removal, pouching system preparation and application process. At the conclusion of the session I learned she has been living at [facility] in the independent living section. The family understands they will need to check with the facility to find out if they can support ostomy care at that level, or if she will need a higher level of care.  The patient remains confused today and unable to learn the information needed to perform self care. Enrolled patient in [facility] Start Discharge program: No   Additional pouching supplies obtained and placed in the room. Val Riles, RN, MSN, CWOCN, CNS-BC, pager [phone]

## 2021-09-08 NOTE — Progress Notes (Signed)
Patient transferred from ICU @ 18:50 -report given by [facility] RN (ICU) per this RN foley & NGT removed today. Foley was removed @ 1300 per [facility] RN & had only voided 36m at time of transfer.  A/Ox4/forgetful - hard of hearing Patient connected to Telemetry & verified VSS, denies pain/nausea Patient son & daughter-in-law at bedside. SCDs in room  Pt & family oriented to room & call bell system.

## 2021-09-08 NOTE — Progress Notes (Addendum)
[facility] Progress Note  2 Days Post-Op  Subjective: CC:  Patient awake and alert. She is pleasantly confused this morning - she thinks she may have been moved from [place] [facility] to [place] by ambulance. She also thinks someone was talking to her about another surgery this morning. She is asking that someone contact her son, whom she trusts, so he can help clarify that she is still at [facility] and does not need further surgery.    She Denies abd pain. Mild throat discomfort and hoarseness from NGT.   Objective: Vital signs in last 24 hours: Temp:  [97 F (36.1 C)-98.5 F (36.9 C)] 98.1 F (36.7 C) (07/06 0400) Pulse Rate:  [73-113] 78 (07/06 0600) Resp:  [12-27] 18 (07/06 0600) BP: (98-138)/(43-67) 131/52 (07/06 0600) SpO2:  [86 %-98 %] 96 % (07/06 0600) Last BM Date : 09/07/21  Intake/Output from previous day: 07/05 0701 - 07/06 0700 In: 841.8 [I.V.:743.3; IV Piggyback:98.5] Out: 1633 [Urine:1243; Emesis/NG output:300; Drains:80; Stool:10] Intake/Output this shift: No intake/output data recorded.  PE: Gen:  Alert, NAD, pleasant Card:  Regular rate and rhythm, no lower extremity edema.  Pulm:  Normal effort Abd: Soft, appropriately tender around incision, honeycomb c/d/I, end colostomy with viable stoma - soft semisolid brown stool in pouch. JP SS. GU: foley in place draining clear yellow urine Skin: warm and dry, no rashes  Psych: A&Ox3   Lab Results:  Recent Labs    09/07/21 0246 09/08/21 0804  WBC 18.6* 18.0*  HGB 10.7* 11.3*  HCT 32.7* 34.5*  PLT 277 323   BMET Recent Labs    09/06/21 1025 09/07/21 0246  NA 131* 135  K 4.2 4.4  CL 94* 103  CO2 22 22  GLUCOSE 220* 251*  BUN 75* 64*  CREATININE 1.98* 1.48*  CALCIUM 9.5 7.8*   PT/INR Recent Labs    09/06/21 1130  LABPROT 16.6*  INR 1.4*   CMP     Component Value Date/Time   NA 135 09/07/2021 0246   K 4.4 09/07/2021 0246   CL 103 09/07/2021 0246   CO2 22 09/07/2021 0246    GLUCOSE 251 (H) 09/07/2021 0246   BUN 64 (H) 09/07/2021 0246   CREATININE 1.48 (H) 09/07/2021 0246   CALCIUM 7.8 (L) 09/07/2021 0246   PROT 7.0 09/06/2021 1025   ALBUMIN 3.4 (L) 09/06/2021 1025   AST 14 (L) 09/06/2021 1025   ALT 14 09/06/2021 1025   ALKPHOS 65 09/06/2021 1025   BILITOT 0.5 09/06/2021 1025   GFRNONAA 34 (L) 09/07/2021 0246   GFRAA  07/06/2009 0420    >60        The eGFR has been calculated using the MDRD equation. This calculation has not been validated in all clinical situations. eGFR's persistently <60 mL/min signify possible Chronic Kidney Disease.   Lipase     Component Value Date/Time   LIPASE 13 09/06/2021 1025       Studies/Results: DG Abd 1 View  Result Date: 09/07/2021 CLINICAL DATA:  NG tube placement. EXAM: ABDOMEN - 1 VIEW COMPARISON:  CT yesterday. FINDINGS: Tip and side port of the enteric tube below the diaphragm in the stomach. Moderate stool in the colon. IMPRESSION: Tip and side port of the enteric tube below the diaphragm in the stomach. Electronically Signed   By: Keith Rake M.D.   On: 09/07/2021 01:50   CT ABDOMEN PELVIS WO CONTRAST  Result Date: 09/06/2021 CLINICAL DATA:  Acute abdominal pain. Mid abdominal pain for  3 days. EXAM: CT ABDOMEN AND PELVIS WITHOUT CONTRAST TECHNIQUE: Multidetector CT imaging of the abdomen and pelvis was performed following the standard protocol without IV contrast. RADIATION DOSE REDUCTION: This exam was performed according to the departmental dose-optimization program which includes automated exposure control, adjustment of the mA and/or kV according to patient size and/or use of iterative reconstruction technique. COMPARISON:  CT abdomen dated 09/23/2018. FINDINGS: Lower chest: No acute abnormality. Hepatobiliary: No focal liver abnormality is seen. Gallbladder is unremarkable. No bile duct dilatation seen. Pancreas: Unremarkable. No pancreatic ductal dilatation or surrounding inflammatory changes. Spleen:  Normal in size without focal abnormality. Adrenals/Urinary Tract: Adrenal glands appear normal. Moderate RIGHT-sided hydronephrosis. LEFT kidney is unremarkable without stone or hydronephrosis. No RIGHT-sided ureteral stone identified. There is a complex collection of soft tissue thickening and gas within the RIGHT hemipelvis, with surrounding inflammation/fluid/phlegmon, which appears to be causing the hydronephrosis. Stomach/Bowel: Extensive soft tissue thickening and inflammatory change involving the small bowel of the RIGHT upper pelvis and abutting sigmoid colon (axial series 2, images 50 through 60; coronal series 5, images 35 through 48). There is tortuosity and narrowing of the small bowel in the RIGHT lower quadrant leading into this area of inflammation, raising the possibility of malrotation/volvulus or internal hernia with associated obstruction and/or ischemia. There is oral contrast within the sigmoid colon and within the inflamed-appearing small bowel loops overlying the sigmoid colon, also suspicious for enterocolic fistula. As above, this soft tissue thickening/inflammation and bowel wall thickening/inflammation is resulting in the moderate RIGHT-sided hydronephrosis. Extensive diverticulosis of the sigmoid colon in the LEFT pelvis. Distention of the transverse colon and LEFT colon, filled with stool suggesting some degree of associated colonic obstruction. Mild prominence of the small bowel in the RIGHT lower quadrant, also suggesting a partial obstruction. Stomach is unremarkable. Vascular/Lymphatic: Extensive aortic atherosclerosis. Reproductive: Presumed hysterectomy.  No adnexal mass identified. Other: No free intraperitoneal air identified. Musculoskeletal: Degenerative spondylosis of the slightly scoliotic thoracolumbar spine. No acute-appearing osseous abnormality. IMPRESSION: 1. Marked thickening and inflammatory change involving the small bowel of the RIGHT upper pelvis and the  adjacent/underlying sigmoid colon. There is tortuosity and narrowing of the small bowel in this area of inflammation, and tortuosity of the small bowel and mesentery of the overlying RIGHT lower quadrant, raising the possibility of malrotation/volvulus or internal hernia with resultant small bowel ischemia. 2. Additionally, there is oral contrast both within the abnormal-appearing sigmoid colon and within the inflamed-appearing small bowel loops abutting the sigmoid colon, suspicious for associated enterocolic fistula. 3. Distention of the transverse colon and LEFT colon, filled with stool, suggesting some degree of associated colonic obstruction. 4. Mildly dilated small bowel within the RIGHT lower quadrant, also suggesting partial obstruction. 5. Extensive diverticulosis of the sigmoid colon in the LEFT pelvis. Aortic Atherosclerosis (ICD10-I70.0). If additional imaging characterization were needed, would consider repeat CT abdomen and pelvis with oral and rectal contrast. These results and recommendations were called by telephone at the time of interpretation on 09/06/2021 at 12:30 pm to provider [facility] RAY , who verbally acknowledged these results. Electronically Signed   By: Franki Cabot M.D.   On: 09/06/2021 12:40   DG Chest Port 1 View  Result Date: 09/06/2021 CLINICAL DATA:  Fatigue and diarrhea.  Questionable sepsis. EXAM: PORTABLE CHEST 1 VIEW COMPARISON:  03/09/2015 FINDINGS: The lungs are clear without focal pneumonia, edema, pneumothorax or pleural effusion. The cardiopericardial silhouette is within normal limits for size. The visualized bony structures of the thorax are unremarkable. Telemetry leads overlie  the chest. IMPRESSION: No active disease. Electronically Signed   By: Misty Stanley M.D.   On: 09/06/2021 12:08    Anti-infectives: Anti-infectives (From admission, onward)    Start     Dose/Rate Route Frequency Ordered Stop   09/07/21 1300  ceFEPIme (MAXIPIME) 2 g in sodium chloride 0.9  % 100 mL IVPB  Status:  Discontinued        2 g 200 mL/hr over 30 Minutes Intravenous Every 24 hours 09/06/21 1307 09/06/21 1943   09/06/21 2200  piperacillin-tazobactam (ZOSYN) IVPB 2.25 g        2.25 g 100 mL/hr over 30 Minutes Intravenous Every 8 hours 09/06/21 1943     09/06/21 1230  ceFEPIme (MAXIPIME) 2 g in sodium chloride 0.9 % 100 mL IVPB        2 g 200 mL/hr over 30 Minutes Intravenous  Once 09/06/21 1229 09/06/21 1404   09/06/21 1230  metroNIDAZOLE (FLAGYL) IVPB 500 mg        500 mg 100 mL/hr over 60 Minutes Intravenous  Once 09/06/21 1229 09/06/21 1537        Assessment/Plan Infarcted sigmoid colon  S/p exploratory laparotomy, sigmoid colectomy, end descending colostomy 09/06/21 Dr. Harlow Asa -  await path, per Dr. Harlow Asa suspect due to diverticular disease  - POD#2, afebrile, VSS, WBC 23 > 18 > 18 stable - creatinine this morning is pending but was previously improving 1.48 (1.98)  - having stool via colostomy, 400 cc in NG cannister over 24 hours. NG clamp trial (clamped by me at 0800). If tolerates then possible NG removal around 1400 - WOC RN following for new ostomy - OOB, PT/OT eval  - will keep foley today for strict I&Os, monitoring of renal function.   FEN: NPO, IVF, ice chips ok, NG to LIWS ID: Zosyn 7/4 >> continue for 5 days post-op.  Foley: 1300 cc/24h clear yellow urine. Ok to d/c foley from surgical perspective. VTE: SCD's, SQH Dispo: SDU >> ok to transfer to floor form [facility] perspective.  I attempted to call her son on his cell phone and he did not answer. Patient asked that I call her daughter-in-law, Lynelle Smoke, however that number is not on the patients chart so I was unable to call her.  AKI - IVF per primary team Hypothyroidism HTN   LOS: 2 days    Obie Dredge, [facility] Please see Amion for pager number during day hours 7:00am-4:30pm

## 2021-09-08 NOTE — Progress Notes (Addendum)
PROGRESS NOTE  Jenna Thomas IZT:[id number] DOB: [birth year] DOA: 09/06/2021 PCP: Sueanne Margarita, DO   LOS: 2 days   Brief Narrative / Interim history: 88 y.o. female with medical history significant of hypothyroidism, HTN, came with worsening of abdominal pain.  CT scan of the abdomen pelvis on admission showed volvulus/malrotation, chronic obstruction versus ischemia, right-sided hydronephrosis.  General surgery consulted, she was taken urgently to the OR on 7/4 and was found to have infarcted sigmoid colon, status post sigmoid colectomy and end descending colostomy.  Subjective / 24h Interval events: No complaints.  Remains confused.  Denies any abdominal pain, no nausea or vomiting.  Assesement and Plan: Principal Problem:   Bowel obstruction (HCC) Active Problems:   AKI (acute kidney injury) ([place])   Hydronephrosis of right kidney   Acute metabolic encephalopathy   Stage 3a chronic kidney disease (CKD) (HCC)   Hypothyroidism   HTN (hypertension)   Principal problem Infarcted sigmoid colon-status post ex lap, sigmoid colectomy and end descending colostomy.  Suspect due to diverticular disease.  Appreciate general surgery follow-up.  Continue NG tube, IV fluids.  Clamping trial this morning per general surgery with potential removal of this afternoon.  She has been placed on antibiotics with Zosyn, continue for 5 days postoperatively, today's day #2.  White count still elevated but overall stable since yesterday.  Active problems Right-sided hydronephrosis-possibly due to dense pelvic adhesions from colonic process.  Status post sigmoid colectomy and end colostomy on 7/4.  Renal function continues to improve.  Urology evaluated patient on 7/5, no need for reimaging as long as renal function is stable  Acute metabolic encephalopathy-confused postoperatively, son told me that she has been having intermittent memory problems for some time. Suspect a degree of underlying dementia  worsened by this acute hospitalization  AKI on CKD 3A-baseline creatinine around 1.4.  Possibly related to hydronephrosis, received IV fluids, underwent surgery, now renal function is stable and improving  Essential hypertension-continue to hold home agents, consider resuming within the next 24 hours if NG tube is out and she will have some p.o. intake  Hypothyroidism-continue Synthroid.  TSH 8.0, hard to interpret in the setting of an acute illness.  Repeat in 3 weeks as an outpatient  Scheduled Meds:  Chlorhexidine Gluconate Cloth  6 each Topical Daily   heparin  5,000 Units Subcutaneous Q12H   insulin aspart  0-9 Units Subcutaneous TID WC   levothyroxine  75 mcg Oral QAC breakfast   mouth rinse  15 mL Mouth Rinse 4 times per day   pantoprazole (PROTONIX) IV  40 mg Intravenous QHS   Continuous Infusions:  sodium chloride     sodium chloride 75 mL/hr at 09/08/21 0900   piperacillin-tazobactam (ZOSYN)  IV Stopped (09/08/21 0229)   PRN Meds:.sodium chloride, acetaminophen **OR** acetaminophen, hydrALAZINE, HYDROmorphone (DILAUDID) injection, ondansetron **OR** ondansetron (ZOFRAN) IV, mouth rinse  Diet Orders (From admission, onward)     Start     Ordered   09/06/21 1918  Diet NPO time specified  Diet effective now        09/06/21 1917            DVT prophylaxis: SCD's Start: 09/06/21 2348 heparin injection 5,000 Units Start: 09/06/21 2200   Lab Results  Component Value Date   PLT 323 09/08/2021      Code Status: Full Code  Family Communication: No family at bedside  Status is: Inpatient  Remains inpatient appropriate because: Severity of illness  Level of care: Progressive  Consultants:  General surgery   Objective: Vitals:   09/08/21 0500 09/08/21 0600 09/08/21 0745 09/08/21 0800  BP: 128/62 (!) 131/52  (!) 125/54  Pulse: 80 78  81  Resp: '17 18  19  '$ Temp:   98.3 F (36.8 C)   TempSrc:   Oral   SpO2: 98% 96%  98%  Weight:      Height:         Intake/Output Summary (Last 24 hours) at 09/08/2021 0925 Last data filed at 09/08/2021 0900 Gross per 24 hour  Intake 1946.84 ml  Output 1633 ml  Net 313.84 ml   Wt Readings from Last 3 Encounters:  09/06/21 45.5 kg  12/12/18 46.3 kg  10/24/18 45.8 kg    Examination:  Constitutional: NAD Eyes: lids and conjunctivae normal, no scleral icterus ENMT: mmm Neck: normal, supple Respiratory: clear to auscultation bilaterally, no wheezing, no crackles. Normal respiratory effort.  Cardiovascular: Regular rate and rhythm, no murmurs / rubs / gallops. No LE edema. Abdomen: Mild tenderness, ostomy bag in place. Skin: no rashes Neurologic: no focal deficits, equal strength  Data Reviewed: I have independently reviewed following labs and imaging studies   CBC Recent Labs  Lab 09/06/21 1025 09/07/21 0246 09/08/21 0804  WBC 23.4* 18.6* 18.0*  HGB 11.8* 10.7* 11.3*  HCT 35.8* 32.7* 34.5*  PLT 331 277 323  MCV 98.9 100.0 99.4  MCH 32.6 32.7 32.6  MCHC 33.0 32.7 32.8  RDW 13.4 13.5 13.8    Recent Labs  Lab 09/06/21 1025 09/06/21 1130 09/06/21 1649 09/06/21 1803 09/07/21 0246 09/08/21 0804  NA 131*  --   --   --  135 139  K 4.2  --   --   --  4.4 4.1  CL 94*  --   --   --  103 108  CO2 22  --   --   --  22 22  GLUCOSE 220*  --   --   --  251* 125*  BUN 75*  --   --   --  64* 43*  CREATININE 1.98*  --   --   --  1.48* 1.31*  CALCIUM 9.5  --   --   --  7.8* 7.5*  AST 14*  --   --   --   --  18  ALT 14  --   --   --   --  18  ALKPHOS 65  --   --   --   --  60  BILITOT 0.5  --   --   --   --  0.9  ALBUMIN 3.4*  --   --   --   --  2.2*  LATICACIDVEN  --  1.8  --   --   --   --   INR  --  1.4*  --   --   --   --   TSH  --   --  8.025*  --   --   --   HGBA1C  --   --   --  5.9*  --   --     ------------------------------------------------------------------------------------------------------------------ No results for input(s): "CHOL", "HDL", "LDLCALC", "TRIG",  "CHOLHDL", "LDLDIRECT" in the last 72 hours.  Lab Results  Component Value Date   HGBA1C 5.9 (H) 09/06/2021   ------------------------------------------------------------------------------------------------------------------ Recent Labs    09/06/21 1649  TSH 8.025*    Cardiac Enzymes No results for input(s): "CKMB", "TROPONINI", "MYOGLOBIN" in the last 168 hours.  Invalid input(s): "  CK" ------------------------------------------------------------------------------------------------------------------ No results found for: "BNP"  CBG: Recent Labs  Lab 09/07/21 0751 09/07/21 1158 09/07/21 1658 09/07/21 2121 09/08/21 0736  GLUCAP 255* 124* 116* 111* 115*    Recent Results (from the past 240 hour(s))  Blood Culture (routine x 2)     Status: None (Preliminary result)   Collection Time: 09/06/21 11:23 AM   Specimen: BLOOD RIGHT FOREARM  Result Value Ref Range Status   Specimen Description   Final    BLOOD RIGHT FOREARM Performed at [facility], [address] [place], [place] [address]    Special Requests   Final    BOTTLES DRAWN AEROBIC AND ANAEROBIC Blood Culture results may not be optimal due to an inadequate volume of blood received in culture bottles Performed at [facility], [address] [place], [place] [address]    Culture   Final    NO GROWTH < 24 HOURS Performed at [facility], [place] [address] [place], [place] [address]    Report Status PENDING  Incomplete  Blood Culture (routine x 2)     Status: None (Preliminary result)   Collection Time: 09/06/21 11:30 AM   Specimen: Left Antecubital; Blood  Result Value Ref Range Status   Specimen Description   Final    LEFT ANTECUBITAL Performed at [facility], [address] [place], [place] [address]    Special Requests   Final    BOTTLES DRAWN AEROBIC AND ANAEROBIC Blood Culture adequate volume Performed at [facility],  [address] [place], [place] [address]    Culture   Final    NO GROWTH < 24 HOURS Performed at [facility], [place] [address], [place], [place] [address]    Report Status PENDING  Incomplete  Resp Panel by RT-PCR (Flu A&B, Covid) Anterior Nasal Swab     Status: None   Collection Time: 09/06/21  2:38 PM   Specimen: Anterior Nasal Swab  Result Value Ref Range Status   SARS Coronavirus 2 by RT PCR NEGATIVE NEGATIVE Final    Comment: (NOTE) SARS-CoV-2 target nucleic acids are NOT DETECTED.  The SARS-CoV-2 RNA is generally detectable in upper respiratory specimens during the acute phase of infection. The lowest concentration of SARS-CoV-2 viral copies this assay can detect is 138 copies/mL. A negative result does not preclude SARS-Cov-2 infection and should not be used as the sole basis for treatment or other patient management decisions. A negative result may occur with  improper specimen collection/handling, submission of specimen other than nasopharyngeal swab, presence of viral mutation(s) within the areas targeted by this assay, and inadequate number of viral copies(<138 copies/mL). A negative result must be combined with clinical observations, patient history, and epidemiological information. The expected result is Negative.  Fact Sheet for Patients:  EntrepreneurPulse.com.au  Fact Sheet for Healthcare Providers:  IncredibleEmployment.be  This test is no t yet approved or cleared by the [place] FDA and  has been authorized for detection and/or diagnosis of SARS-CoV-2 by FDA under an Emergency Use Authorization (EUA). This EUA will remain  in effect (meaning this test can be used) for the duration of the COVID-19 declaration under Section 564(b)(1) of the Act, 21 U.S.C.section 360bbb-3(b)(1), unless the authorization is terminated  or revoked sooner.       Influenza A by PCR NEGATIVE NEGATIVE Final   Influenza B by PCR  NEGATIVE NEGATIVE Final    Comment: (NOTE) The Xpert Xpress SARS-CoV-2/FLU/RSV plus assay is intended as an  aid in the diagnosis of influenza from Nasopharyngeal swab specimens and should not be used as a sole basis for treatment. Nasal washings and aspirates are unacceptable for Xpert Xpress SARS-CoV-2/FLU/RSV testing.  Fact Sheet for Patients: EntrepreneurPulse.com.au  Fact Sheet for Healthcare Providers: IncredibleEmployment.be  This test is not yet approved or cleared by the [place] FDA and has been authorized for detection and/or diagnosis of SARS-CoV-2 by FDA under an Emergency Use Authorization (EUA). This EUA will remain in effect (meaning this test can be used) for the duration of the COVID-19 declaration under Section 564(b)(1) of the Act, 21 U.S.C. section 360bbb-3(b)(1), unless the authorization is terminated or revoked.  Performed at [facility], [address], [place], [place] [address]   MRSA Next Gen by PCR, Nasal     Status: None   Collection Time: 09/06/21  4:22 PM   Specimen: Nasal Mucosa; Nasal Swab  Result Value Ref Range Status   MRSA by PCR Next Gen NOT DETECTED NOT DETECTED Final    Comment: (NOTE) The GeneXpert MRSA Assay (FDA approved for NASAL specimens only), is one component of a comprehensive MRSA colonization surveillance program. It is not intended to diagnose MRSA infection nor to guide or monitor treatment for MRSA infections. Test performance is not FDA approved in patients less than 47 years old. Performed at [facility], [place] [address] [place], [place] [address]      Radiology Studies: No results found.   Marzetta Board, MD, PhD [facility]  Between 7 am - 7 pm I am available, please contact me via Amion (for emergencies) or Securechat (non urgent messages)  Between 7 pm - 7 am I am not available, please contact night coverage MD/APP via  Amion

## 2021-09-08 NOTE — Plan of Care (Signed)
  Problem: Clinical Measurements: Goal: Ability to maintain clinical measurements within normal limits will improve Outcome: Progressing Goal: Cardiovascular complication will be avoided Outcome: Progressing   Problem: Activity: Goal: Risk for activity intolerance will decrease Outcome: Progressing   Problem: Pain Managment: Goal: General experience of comfort will improve Outcome: Progressing   Problem: Education: Goal: Knowledge of General Education information will improve Description: Including pain rating scale, medication(s)/side effects and non-pharmacologic comfort measures Outcome: Not Progressing   Problem: Health Behavior/Discharge Planning: Goal: Ability to manage health-related needs will improve Outcome: Not Progressing

## 2021-09-08 NOTE — Progress Notes (Addendum)
Pharmacy Antibiotic Note  Jenna Thomas is a 88 y.o. female admitted on 09/06/2021 with worsening abdominal pain. CT abdomen pelvis showed extensive findings, including concern for malrotation/volvulus or internal hernia with resultant small bowel ischemia, enterocolic fistula, colonic obstruction, partial small bowel obstruction, and right-sided hydronephrosis. Patient with infarcted sigmoid colon s/p ex lap with sigmoid colectomy, end descending colostomy 7/4. Pharmacy has been consulted for Zosyn dosing.  Plan: -SCr improving, CrCl 21 -Change Zosyn to 3.375 g IV q8h extended infusion -Per Surgery, continue Zosyn for 5 days post op - today is day 2 -Per consult, 5 day stop has been entered -Pharmacy to sign off of consult but will continue to follow renal function and clinical progress for dose adjustments, de-escalation as indicated   Height: '5\' 5"'$  (165.1 cm) Weight: 45.5 kg (100 lb 5 oz) IBW/kg (Calculated) : 57  Temp (24hrs), Avg:98 F (36.7 C), Min:97 F (36.1 C), Max:98.5 F (36.9 C)  Recent Labs  Lab 09/06/21 1025 09/06/21 1130 09/07/21 0246 09/08/21 0804  WBC 23.4*  --  18.6* 18.0*  CREATININE 1.98*  --  1.48* 1.31*  LATICACIDVEN  --  1.8  --   --      Estimated Creatinine Clearance: 21.3 mL/min (A) (by C-G formula based on SCr of 1.31 mg/dL (H)).    Allergies  Allergen Reactions   Sulfa Antibiotics Other (See Comments)    Does not remember reaction    Antimicrobials this admission: 7/4 Cefepime x 1  7/4 Metronidazole x 1 7/4 Zosyn >>  Microbiology results: 7/4 BCx: ngtd 7/4 MRSA PCR: negative   Thank you for allowing pharmacy to be a part of this patient's care.  Tawnya Crook, PharmD, BCPS Clinical Pharmacist 09/08/2021 11:30 AM

## 2021-09-09 DIAGNOSIS — K562 Volvulus: Secondary | ICD-10-CM | POA: Diagnosis not present

## 2021-09-09 LAB — CBC
HCT: 31.6 % — ABNORMAL LOW (ref 36.0–46.0)
Hemoglobin: 10.4 g/dL — ABNORMAL LOW (ref 12.0–15.0)
MCH: 33 pg (ref 26.0–34.0)
MCHC: 32.9 g/dL (ref 30.0–36.0)
MCV: 100.3 fL — ABNORMAL HIGH (ref 80.0–100.0)
Platelets: 311 10*3/uL (ref 150–400)
RBC: 3.15 MIL/uL — ABNORMAL LOW (ref 3.87–5.11)
RDW: 14.1 % (ref 11.5–15.5)
WBC: 14.4 10*3/uL — ABNORMAL HIGH (ref 4.0–10.5)
nRBC: 0 % (ref 0.0–0.2)

## 2021-09-09 LAB — BASIC METABOLIC PANEL
Anion gap: 8 (ref 5–15)
BUN: 36 mg/dL — ABNORMAL HIGH (ref 8–23)
CO2: 22 mmol/L (ref 22–32)
Calcium: 7.4 mg/dL — ABNORMAL LOW (ref 8.9–10.3)
Chloride: 109 mmol/L (ref 98–111)
Creatinine, Ser: 1.2 mg/dL — ABNORMAL HIGH (ref 0.44–1.00)
GFR, Estimated: 44 mL/min — ABNORMAL LOW (ref >60)
Glucose, Bld: 120 mg/dL — ABNORMAL HIGH (ref 70–99)
Potassium: 3.8 mmol/L (ref 3.5–5.1)
Sodium: 139 mmol/L (ref 135–145)

## 2021-09-09 LAB — GLUCOSE, CAPILLARY
Glucose-Capillary: 121 mg/dL — ABNORMAL HIGH (ref 70–99)
Glucose-Capillary: 178 mg/dL — ABNORMAL HIGH (ref 70–99)
Glucose-Capillary: 130 mg/dL — ABNORMAL HIGH (ref 70–99)
Glucose-Capillary: 154 mg/dL — ABNORMAL HIGH (ref 70–99)

## 2021-09-09 LAB — SURGICAL PATHOLOGY

## 2021-09-09 MED ORDER — ENSURE ENLIVE PO LIQD
237.0000 mL | Freq: Three times a day (TID) | ORAL | Status: DC
  Administered 2021-09-09 – 2021-09-15 (×14): 237 mL via ORAL

## 2021-09-09 NOTE — Care Management Important Message (Signed)
Important Message  Patient Details IM Letter given to the Patient. Name: NOLAN LASSER MRN: [id number] Date of Birth: [birth year]   Medicare Important Message Given:  Yes     Kerin Salen 09/09/2021, 11:14 AM

## 2021-09-09 NOTE — Progress Notes (Signed)
Physical Therapy Treatment Patient Details Name: Jenna Thomas MRN: [id number] DOB: 1931-10-15 Today's Date: 09/09/2021   History of Present Illness 88 y.o. female with medical history significant of hypothyroidism, HTN, macular degeneration.  Pt presented with worsening of abdominal pain.  CT scan of the abdomen pelvis on admission showed volvulus/malrotation, chronic obstruction versus ischemia, right-sided hydronephrosis.  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    PT Comments    POD # 3 Attempted to see in am.  Pt requested I come back later.  Drinking a supplement.  PM daughter was present.  Assisted OOB to amb to bathroom then in hallway required increased time.  General transfer comment: 50% VC's on proper hand placement and safety with turns.  Also asissted with a toilet transfer.  Mild unsteady esp with turns and back steps to toilet. General Gait Details: verbal cues for safe use of RW esp with turns.  Trial amb with SPC a few feet but way to unsteady so reverted back to walker. Advised pt and daughter to use walker "until stronger".  Pt was amb with a cane prior to surgery.   Pt plans to return to [employer] with [facility] PT and private duty services arranged by daughter.    Recommendations for follow up therapy are one component of a multi-disciplinary discharge planning process, led by the attending physician.  Recommendations may be updated based on patient status, additional functional criteria and insurance authorization.  Follow Up Recommendations  Home health PT     Assistance Recommended at Discharge Frequent or constant Supervision/Assistance  Patient can return home with the following A little help with walking and/or transfers;A little help with bathing/dressing/bathroom;Direct supervision/assist for medications management;Assistance  with cooking/housework   Equipment Recommendations  None recommended by PT    Recommendations for Other Services       Precautions / Restrictions Precautions Precautions: Fall Precaution Comments: colostomy, R lower abdomen JP drain Restrictions Weight Bearing Restrictions: No     Mobility  Bed Mobility Overal bed mobility: Needs Assistance Bed Mobility: Supine to Sit, Sit to Supine     Supine to sit: Min assist, Mod assist Sit to supine: Mod assist, Max assist   General bed mobility comments: required increased assist for upper body OOB and lower body back into bed.    Transfers Overall transfer level: Needs assistance Equipment used: Rolling walker (2 wheels), None Transfers: Sit to/from Stand Sit to Stand: Min assist           General transfer comment: 50% VC's on proper hand placement and safety with turns.  Also asissted with a toilet transfer.  Mild unsteady esp with turns and back steps to toilet.    Ambulation/Gait Ambulation/Gait assistance: Min assist Gait Distance (Feet): 55 Feet Assistive device: Rolling walker (2 wheels), Straight cane Gait Pattern/deviations: Step-through pattern, Decreased stride length, Trunk flexed Gait velocity: decr     General Gait Details: verbal cues for safe use of RW esp with turns.  Trial amb with SPC a few feet but way to unsteady so reverted back to walker.   Stairs             Wheelchair Mobility    Modified Rankin (Stroke Patients Only)       Balance  Cognition Arousal/Alertness: Awake/alert Behavior During Therapy: WFL for tasks assessed/performed Overall Cognitive Status: Within Functional Limits for tasks assessed                                 General Comments: AxO x 2 at times confused requiring repeat functional VC's to complete task.  Pleasant.  Cognition improving.        Exercises      General Comments         Pertinent Vitals/Pain Pain Assessment Pain Assessment: Faces Faces Pain Scale: Hurts a little bit Pain Descriptors / Indicators: Grimacing, Discomfort Pain Intervention(s): Monitored during session    Home Living                          Prior Function            PT Goals (current goals can now be found in the care plan section) Progress towards PT goals: Progressing toward goals    Frequency    Min 3X/week      PT Plan Current plan remains appropriate    Co-evaluation              AM-PAC PT "6 Clicks" Mobility   Outcome Measure  Help needed turning from your back to your side while in a flat bed without using bedrails?: A Little Help needed moving from lying on your back to sitting on the side of a flat bed without using bedrails?: A Little Help needed moving to and from a bed to a chair (including a wheelchair)?: A Little Help needed standing up from a chair using your arms (e.g., wheelchair or bedside chair)?: A Little Help needed to walk in hospital room?: A Little Help needed climbing 3-5 steps with a railing? : A Lot 6 Click Score: 17    End of Session Equipment Utilized During Treatment: Gait belt Activity Tolerance: Patient tolerated treatment well Patient left: in bed;with bed alarm set;with call bell/phone within reach;with family/visitor present Nurse Communication: Mobility status PT Visit Diagnosis: Difficulty in walking, not elsewhere classified (R26.2)     Time: 2956-2130 PT Time Calculation (min) (ACUTE ONLY): 32 min  Charges:  $Gait Training: 8-22 mins $Therapeutic Activity: 8-22 mins                     {Shayna Eblen  PTA Acute  [employer] M-F          [phone] Weekend pager [phone]

## 2021-09-09 NOTE — Progress Notes (Signed)
PROGRESS NOTE  Jenna Thomas:[id number] DOB: Nov 02, 1931 DOA: 09/06/2021 PCP: Sueanne Margarita, DO   LOS: 3 days   Brief Narrative / Interim history: 88 y.o. female with medical history significant of hypothyroidism, HTN, came with worsening of abdominal pain.  CT scan of the abdomen pelvis on admission showed volvulus/malrotation, chronic obstruction versus ischemia, right-sided hydronephrosis.  General surgery consulted, she was taken urgently to the OR on 7/4 and was found to have infarcted sigmoid colon, status post sigmoid colectomy and end descending colostomy.  Subjective / 24h Interval events: Feeling well this morning.  She denies any abdominal pain, no nausea or vomiting.  Assesement and Plan: Principal Problem:   Bowel obstruction (HCC) Active Problems:   AKI (acute kidney injury) ([place])   Hydronephrosis of right kidney   Acute metabolic encephalopathy   Stage 3a chronic kidney disease (CKD) (HCC)   Hypothyroidism   HTN (hypertension)   Principal problem Infarcted sigmoid colon-status post ex lap, sigmoid colectomy and end descending colostomy.  Suspect due to diverticular disease.  Appreciate general surgery follow-up.  Initially required NG tube but now this has been removed on 7/6.  Advance diet per general surgery.  Continue Zosyn, today day #3, plan for 5 days total.  White count is improving.  Active problems Right-sided hydronephrosis-possibly due to dense pelvic adhesions from colonic process.  Status post sigmoid colectomy and end colostomy on 7/4.  Renal function continues to improve.  Urology evaluated patient on 7/5, no need for reimaging as long as renal function is stable  Acute metabolic encephalopathy-confused postoperatively, son told me that she has been having intermittent memory problems for some time. Suspect a degree of underlying dementia worsened by this acute hospitalization  AKI on CKD 3A-baseline creatinine around 1.4.  Continues to improve,  stop IV fluids today to avoid fluid overload.  Essential hypertension-at home she is on losartan/HCTZ, along with spironolactone.  Continue to hold that she is normotensive here  Hypothyroidism-continue Synthroid.  TSH 8.0, hard to interpret in the setting of an acute illness.  Repeat in 3 weeks as an outpatient  Scheduled Meds:  Chlorhexidine Gluconate Cloth  6 each Topical Daily   feeding supplement  237 mL Oral TID WC   heparin  5,000 Units Subcutaneous Q12H   insulin aspart  0-9 Units Subcutaneous TID WC   levothyroxine  75 mcg Oral QAC breakfast   mouth rinse  15 mL Mouth Rinse 4 times per day   pantoprazole (PROTONIX) IV  40 mg Intravenous QHS   Continuous Infusions:  sodium chloride     sodium chloride 75 mL/hr at 09/08/21 1800   piperacillin-tazobactam (ZOSYN)  IV 3.375 g (09/09/21 1040)   PRN Meds:.sodium chloride, acetaminophen **OR** acetaminophen, hydrALAZINE, HYDROmorphone (DILAUDID) injection, ondansetron **OR** ondansetron (ZOFRAN) IV, mouth rinse  Diet Orders (From admission, onward)     Start     Ordered   09/09/21 1037  Diet full liquid Room service appropriate? Yes; Fluid consistency: Thin  Diet effective now       Question Answer Comment  Room service appropriate? Yes   Fluid consistency: Thin      09/09/21 1036            DVT prophylaxis: SCD's Start: 09/06/21 2348 heparin injection 5,000 Units Start: 09/06/21 2200   Lab Results  Component Value Date   PLT 311 09/09/2021      Code Status: Full Code  Family Communication: No family at bedside  Status is: Inpatient  Remains inpatient appropriate  because: Severity of illness  Level of care: Progressive  Consultants:  General surgery   Objective: Vitals:   09/08/21 1917 09/08/21 2320 09/09/21 0132 09/09/21 0530  BP: 135/69 132/72 116/60   Pulse: 80  70 70  Resp: '20 17 20 17  '$ Temp: 97.7 F (36.5 C) 98.2 F (36.8 C) 98.2 F (36.8 C) 98.1 F (36.7 C)  TempSrc: Oral Oral Oral Oral   SpO2: 99% 99% 96% 96%  Weight:      Height:        Intake/Output Summary (Last 24 hours) at 09/09/2021 1050 Last data filed at 09/09/2021 1009 Gross per 24 hour  Intake 1546.38 ml  Output 550 ml  Net 996.38 ml    Wt Readings from Last 3 Encounters:  09/06/21 45.5 kg  12/12/18 46.3 kg  10/24/18 45.8 kg    Examination:  Constitutional: NAD Eyes: lids and conjunctivae normal, no scleral icterus ENMT: mmm Neck: normal, supple Respiratory: clear to auscultation bilaterally, no wheezing, no crackles. Normal respiratory effort.  Cardiovascular: Regular rate and rhythm, no murmurs / rubs / gallops. No LE edema. Abdomen: soft, no distention, no tenderness. Bowel sounds positive.  Skin: no rashes Neurologic: no focal deficits, equal strength  Data Reviewed: I have independently reviewed following labs and imaging studies   CBC Recent Labs  Lab 09/06/21 1025 09/07/21 0246 09/08/21 0804 09/09/21 0427  WBC 23.4* 18.6* 18.0* 14.4*  HGB 11.8* 10.7* 11.3* 10.4*  HCT 35.8* 32.7* 34.5* 31.6*  PLT 331 277 323 311  MCV 98.9 100.0 99.4 100.3*  MCH 32.6 32.7 32.6 33.0  MCHC 33.0 32.7 32.8 32.9  RDW 13.4 13.5 13.8 14.1     Recent Labs  Lab 09/06/21 1025 09/06/21 1130 09/06/21 1649 09/06/21 1803 09/07/21 0246 09/08/21 0804 09/09/21 0427  NA 131*  --   --   --  135 139 139  K 4.2  --   --   --  4.4 4.1 3.8  CL 94*  --   --   --  103 108 109  CO2 22  --   --   --  '22 22 22  '$ GLUCOSE 220*  --   --   --  251* 125* 120*  BUN 75*  --   --   --  64* 43* 36*  CREATININE 1.98*  --   --   --  1.48* 1.31* 1.20*  CALCIUM 9.5  --   --   --  7.8* 7.5* 7.4*  AST 14*  --   --   --   --  18  --   ALT 14  --   --   --   --  18  --   ALKPHOS 65  --   --   --   --  60  --   BILITOT 0.5  --   --   --   --  0.9  --   ALBUMIN 3.4*  --   --   --   --  2.2*  --   LATICACIDVEN  --  1.8  --   --   --   --   --   INR  --  1.4*  --   --   --   --   --   TSH  --   --  8.025*  --   --   --   --    HGBA1C  --   --   --  5.9*  --   --   --      ------------------------------------------------------------------------------------------------------------------  No results for input(s): "CHOL", "HDL", "LDLCALC", "TRIG", "CHOLHDL", "LDLDIRECT" in the last 72 hours.  Lab Results  Component Value Date   HGBA1C 5.9 (H) 09/06/2021   ------------------------------------------------------------------------------------------------------------------ Recent Labs    09/06/21 1649  TSH 8.025*     Cardiac Enzymes No results for input(s): "CKMB", "TROPONINI", "MYOGLOBIN" in the last 168 hours.  Invalid input(s): "CK" ------------------------------------------------------------------------------------------------------------------ No results found for: "BNP"  CBG: Recent Labs  Lab 09/08/21 0736 09/08/21 1138 09/08/21 1658 09/08/21 2110 09/09/21 0719  GLUCAP 115* 116* 104* 100* 121*     Recent Results (from the past 240 hour(s))  Blood Culture (routine x 2)     Status: None (Preliminary result)   Collection Time: 09/06/21 11:23 AM   Specimen: BLOOD RIGHT FOREARM  Result Value Ref Range Status   Specimen Description   Final    BLOOD RIGHT FOREARM Performed at [facility], [address] [place], [place] [address]    Special Requests   Final    BOTTLES DRAWN AEROBIC AND ANAEROBIC Blood Culture results may not be optimal due to an inadequate volume of blood received in culture bottles Performed at [facility], [address] [place], [place] [address]    Culture   Final    NO GROWTH 2 DAYS Performed at [facility], [place] [address] [place], [place] [address]    Report Status PENDING  Incomplete  Blood Culture (routine x 2)     Status: None (Preliminary result)   Collection Time: 09/06/21 11:30 AM   Specimen: BLOOD  Result Value Ref Range Status   Specimen Description   Final    BLOOD LEFT ANTECUBITAL Performed at [facility], [place] [address] [place], [place] [address]    Special Requests   Final    BOTTLES DRAWN AEROBIC AND ANAEROBIC Blood Culture adequate volume Performed at [facility], [address] [place], [place] [address]    Culture   Final    NO GROWTH 2 DAYS Performed at [facility], [place] [address], [place],  [address]    Report Status PENDING  Incomplete  Resp Panel by RT-PCR (Flu A&B, Covid) Anterior Nasal Swab     Status: None   Collection Time: 09/06/21  2:38 PM   Specimen: Anterior Nasal Swab  Result Value Ref Range Status   SARS Coronavirus 2 by RT PCR NEGATIVE NEGATIVE Final    Comment: (NOTE) SARS-CoV-2 target nucleic acids are NOT DETECTED.  The SARS-CoV-2 RNA is generally detectable in upper respiratory specimens during the acute phase of infection. The lowest concentration of SARS-CoV-2 viral copies this assay can detect is 138 copies/mL. A negative result does not preclude SARS-Cov-2 infection and should not be used as the sole basis for treatment or other patient management decisions. A negative result may occur with  improper specimen collection/handling, submission of specimen other than nasopharyngeal swab, presence of viral mutation(s) within the areas targeted by this assay, and inadequate number of viral copies(<138 copies/mL). A negative result must be combined with clinical observations, patient history, and epidemiological information. The expected result is Negative.  Fact Sheet for Patients:  EntrepreneurPulse.com.au  Fact Sheet for Healthcare Providers:  IncredibleEmployment.be  This test is no t yet approved or cleared by the [place] FDA and  has been authorized for detection and/or diagnosis of SARS-CoV-2 by FDA under an Emergency Use Authorization (EUA). This EUA will remain  in effect (meaning this test can be used) for the  duration of the COVID-19 declaration under  Section 564(b)(1) of the Act, 21 U.S.C.section 360bbb-3(b)(1), unless the authorization is terminated  or revoked sooner.       Influenza A by PCR NEGATIVE NEGATIVE Final   Influenza B by PCR NEGATIVE NEGATIVE Final    Comment: (NOTE) The Xpert Xpress SARS-CoV-2/FLU/RSV plus assay is intended as an aid in the diagnosis of influenza from Nasopharyngeal swab specimens and should not be used as a sole basis for treatment. Nasal washings and aspirates are unacceptable for Xpert Xpress SARS-CoV-2/FLU/RSV testing.  Fact Sheet for Patients: EntrepreneurPulse.com.au  Fact Sheet for Healthcare Providers: IncredibleEmployment.be  This test is not yet approved or cleared by the [place] FDA and has been authorized for detection and/or diagnosis of SARS-CoV-2 by FDA under an Emergency Use Authorization (EUA). This EUA will remain in effect (meaning this test can be used) for the duration of the COVID-19 declaration under Section 564(b)(1) of the Act, 21 U.S.C. section 360bbb-3(b)(1), unless the authorization is terminated or revoked.  Performed at [facility], [address], [place], [place] [address]   MRSA Next Gen by PCR, Nasal     Status: None   Collection Time: 09/06/21  4:22 PM   Specimen: Nasal Mucosa; Nasal Swab  Result Value Ref Range Status   MRSA by PCR Next Gen NOT DETECTED NOT DETECTED Final    Comment: (NOTE) The GeneXpert MRSA Assay (FDA approved for NASAL specimens only), is one component of a comprehensive MRSA colonization surveillance program. It is not intended to diagnose MRSA infection nor to guide or monitor treatment for MRSA infections. Test performance is not FDA approved in patients less than 40 years old. Performed at [facility], [place] [address], [place], [place] [address]      Radiology Studies: No results found.   Marzetta Board, MD, PhD [facility]  Between 7 am - 7 pm I am available, please contact me via Amion (for emergencies) or Securechat (non urgent messages)  Between 7 pm - 7 am I am not available, please contact night coverage MD/APP via Amion

## 2021-09-09 NOTE — Progress Notes (Signed)
[facility] Progress Note  3 Days Post-Op  Subjective: CC:  Patient awake and alert. Her son is at bedside. She denies pain or nausea. Tolerating CLD. Walked with PT/OT.   Objective: Vital signs in last 24 hours: Temp:  [97.7 F (36.5 C)-98.2 F (36.8 C)] 98.1 F (36.7 C) (07/07 0530) Pulse Rate:  [70-82] 70 (07/07 0530) Resp:  [17-24] 17 (07/07 0530) BP: (116-150)/(60-77) 116/60 (07/07 0132) SpO2:  [95 %-99 %] 96 % (07/07 0530) Last BM Date : 09/08/21  Intake/Output from previous day: 07/06 0701 - 07/07 0700 In: 1487.3 [P.O.:240; I.V.:1144.4; IV Piggyback:102.9] Out: 550 [Urine:500; Drains:50] Intake/Output this shift: Total I/O In: 360 [P.O.:360] Out: -   PE: Gen:  Alert, NAD, pleasant Card:  Regular rate and rhythm, no lower extremity edema.  Pulm:  Normal effort Abd: Soft, mid distention, appropriately tender around incision, honeycomb removed - staples c/d/I without cellulitis, stoma viable in LLQ, there is gas a soft brown stool in ostomy pouch. GU: foley removed. Skin: warm and dry, no rashes  Psych: A&Ox3   Lab Results:  Recent Labs    09/08/21 0804 09/09/21 0427  WBC 18.0* 14.4*  HGB 11.3* 10.4*  HCT 34.5* 31.6*  PLT 323 311   BMET Recent Labs    09/08/21 0804 09/09/21 0427  NA 139 139  K 4.1 3.8  CL 108 109  CO2 22 22  GLUCOSE 125* 120*  BUN 43* 36*  CREATININE 1.31* 1.20*  CALCIUM 7.5* 7.4*   PT/INR Recent Labs    09/06/21 1130  LABPROT 16.6*  INR 1.4*   CMP     Component Value Date/Time   NA 139 09/09/2021 0427   K 3.8 09/09/2021 0427   CL 109 09/09/2021 0427   CO2 22 09/09/2021 0427   GLUCOSE 120 (H) 09/09/2021 0427   BUN 36 (H) 09/09/2021 0427   CREATININE 1.20 (H) 09/09/2021 0427   CALCIUM 7.4 (L) 09/09/2021 0427   PROT 5.8 (L) 09/08/2021 0804   ALBUMIN 2.2 (L) 09/08/2021 0804   AST 18 09/08/2021 0804   ALT 18 09/08/2021 0804   ALKPHOS 60 09/08/2021 0804   BILITOT 0.9 09/08/2021 0804   GFRNONAA 44 (L)  09/09/2021 0427   GFRAA  07/06/2009 0420    >60        The eGFR has been calculated using the MDRD equation. This calculation has not been validated in all clinical situations. eGFR's persistently <60 mL/min signify possible Chronic Kidney Disease.   Lipase     Component Value Date/Time   LIPASE 13 09/06/2021 1025       Studies/Results: No results found.  Anti-infectives: Anti-infectives (From admission, onward)    Start     Dose/Rate Route Frequency Ordered Stop   09/08/21 1800  piperacillin-tazobactam (ZOSYN) IVPB 3.375 g        3.375 g 12.5 mL/hr over 240 Minutes Intravenous Every 8 hours 09/08/21 1133 09/12/21 0159   09/07/21 1300  ceFEPIme (MAXIPIME) 2 g in sodium chloride 0.9 % 100 mL IVPB  Status:  Discontinued        2 g 200 mL/hr over 30 Minutes Intravenous Every 24 hours 09/06/21 1307 09/06/21 1943   09/06/21 2200  piperacillin-tazobactam (ZOSYN) IVPB 2.25 g  Status:  Discontinued        2.25 g 100 mL/hr over 30 Minutes Intravenous Every 8 hours 09/06/21 1943 09/08/21 1133   09/06/21 1230  ceFEPIme (MAXIPIME) 2 g in sodium chloride 0.9 % 100 mL IVPB  2 g 200 mL/hr over 30 Minutes Intravenous  Once 09/06/21 1229 09/06/21 1404   09/06/21 1230  metroNIDAZOLE (FLAGYL) IVPB 500 mg        500 mg 100 mL/hr over 60 Minutes Intravenous  Once 09/06/21 1229 09/06/21 1537        Assessment/Plan Infarcted sigmoid colon  S/p exploratory laparotomy, sigmoid colectomy, end descending colostomy 09/06/21 Dr. Harlow Asa -  await path, per Dr. Harlow Asa suspect due to diverticular disease  - POD#3, afebrile, VSS, WBC 23 > 18 > 14 downtrending - creatinine improving 1.2 today - NG removed 7/6, tolerating CLD, advance to FLD and add chocolate ensure. - WOC RN following for new ostomy - OOB, mobilize   FEN: FLD, ensure, IVF per primary ID: Zosyn 7/4 >> continue for 5 days post-op.  Foley: removed 7/6, voiding spontaneously  VTE: SCD's, SQH Dispo: med-surg, adv  diet   AKI - IVF per primary team Hypothyroidism HTN   LOS: 3 days    Obie Dredge, [facility] Please see Amion for pager number during day hours 7:00am-4:30pm

## 2021-09-10 DIAGNOSIS — K562 Volvulus: Secondary | ICD-10-CM | POA: Diagnosis not present

## 2021-09-10 LAB — BASIC METABOLIC PANEL
Anion gap: 8 (ref 5–15)
BUN: 29 mg/dL — ABNORMAL HIGH (ref 8–23)
CO2: 23 mmol/L (ref 22–32)
Calcium: 7.1 mg/dL — ABNORMAL LOW (ref 8.9–10.3)
Chloride: 105 mmol/L (ref 98–111)
Creatinine, Ser: 1.11 mg/dL — ABNORMAL HIGH (ref 0.44–1.00)
GFR, Estimated: 48 mL/min — ABNORMAL LOW (ref >60)
Glucose, Bld: 138 mg/dL — ABNORMAL HIGH (ref 70–99)
Potassium: 4.3 mmol/L (ref 3.5–5.1)
Sodium: 136 mmol/L (ref 135–145)

## 2021-09-10 LAB — GLUCOSE, CAPILLARY
Glucose-Capillary: 145 mg/dL — ABNORMAL HIGH (ref 70–99)
Glucose-Capillary: 149 mg/dL — ABNORMAL HIGH (ref 70–99)
Glucose-Capillary: 152 mg/dL — ABNORMAL HIGH (ref 70–99)

## 2021-09-10 LAB — CBC
HCT: 30.9 % — ABNORMAL LOW (ref 36.0–46.0)
Hemoglobin: 10.2 g/dL — ABNORMAL LOW (ref 12.0–15.0)
MCH: 33.2 pg (ref 26.0–34.0)
MCHC: 33 g/dL (ref 30.0–36.0)
MCV: 100.7 fL — ABNORMAL HIGH (ref 80.0–100.0)
Platelets: 360 10*3/uL (ref 150–400)
RBC: 3.07 MIL/uL — ABNORMAL LOW (ref 3.87–5.11)
RDW: 14.2 % (ref 11.5–15.5)
WBC: 16.1 10*3/uL — ABNORMAL HIGH (ref 4.0–10.5)
nRBC: 0 % (ref 0.0–0.2)

## 2021-09-10 MED ORDER — OXYCODONE HCL 5 MG PO TABS
5.0000 mg | ORAL_TABLET | Freq: Four times a day (QID) | ORAL | Status: DC | PRN
  Administered 2021-09-10 – 2021-09-12 (×5): 5 mg via ORAL
  Filled 2021-09-10 (×5): qty 1

## 2021-09-10 NOTE — Progress Notes (Signed)
PROGRESS NOTE  Jenna Thomas:[id number] DOB: 1931-12-14 DOA: 09/06/2021 PCP: Sueanne Margarita, DO   LOS: 4 days   Brief Narrative / Interim history: 88 y.o. female with medical history significant of hypothyroidism, HTN, came with worsening of abdominal pain.  CT scan of the abdomen pelvis on admission showed volvulus/malrotation, chronic obstruction versus ischemia, right-sided hydronephrosis.  General surgery consulted, she was taken urgently to the OR on 7/4 and was found to have infarcted sigmoid colon, status post sigmoid colectomy and end descending colostomy.  Subjective / 24h Interval events: She has no complaints.  Mildly confused.  Has been eating well without nausea or vomiting.  Does not complain of much abdominal pain  Assesement and Plan: Principal Problem:   Bowel obstruction (HCC) Active Problems:   AKI (acute kidney injury) ([place])   Hydronephrosis of right kidney   Acute metabolic encephalopathy   Stage 3a chronic kidney disease (CKD) (HCC)   Hypothyroidism   HTN (hypertension)   Principal problem Infarcted sigmoid colon-status post ex lap, sigmoid colectomy and end descending colostomy.  Suspect due to diverticular disease.  Appreciate general surgery follow-up.  Initially required NG tube but now this has been removed on 7/6.  Advance diet per general surgery.  Continue Zosyn, today day #4, plan for 5 days total.  White count 16 today, continue to monitor  Active problems Right-sided hydronephrosis-possibly due to dense pelvic adhesions from colonic process.  Status post sigmoid colectomy and end colostomy on 7/4.  Creatinine continues to improve today.  Urology evaluated patient on 7/5, no need for reimaging as long as renal function is stable  Acute metabolic encephalopathy-confused postoperatively, son told me that she has been having intermittent memory problems for some time. Suspect a degree of underlying dementia worsened by this acute hospitalization.   Overall stable, appears closer to baseline  AKI on CKD 3A-baseline creatinine around 1.4.  Remains stable off IV fluids  Essential hypertension-at home she is on losartan/HCTZ, along with spironolactone.  Remains normotensive, continue to hold  Hypothyroidism-continue Synthroid.  TSH 8.0, hard to interpret in the setting of an acute illness.  Repeat in 3 weeks as an outpatient  Scheduled Meds:  Chlorhexidine Gluconate Cloth  6 each Topical Daily   feeding supplement  237 mL Oral TID WC   heparin  5,000 Units Subcutaneous Q12H   insulin aspart  0-9 Units Subcutaneous TID WC   levothyroxine  75 mcg Oral QAC breakfast   mouth rinse  15 mL Mouth Rinse 4 times per day   pantoprazole (PROTONIX) IV  40 mg Intravenous QHS   Continuous Infusions:  sodium chloride     piperacillin-tazobactam (ZOSYN)  IV 3.375 g (09/10/21 0946)   PRN Meds:.sodium chloride, acetaminophen **OR** acetaminophen, hydrALAZINE, HYDROmorphone (DILAUDID) injection, ondansetron **OR** ondansetron (ZOFRAN) IV, mouth rinse  Diet Orders (From admission, onward)     Start     Ordered   09/10/21 0835  DIET SOFT Room service appropriate? Yes; Fluid consistency: Thin  Diet effective now       Question Answer Comment  Room service appropriate? Yes   Fluid consistency: Thin      09/10/21 0835            DVT prophylaxis: SCD's Start: 09/06/21 2348 heparin injection 5,000 Units Start: 09/06/21 2200   Lab Results  Component Value Date   PLT 360 09/10/2021      Code Status: Full Code  Family Communication: No family at bedside  Status is: Inpatient  Remains inpatient  appropriate because: Severity of illness  Level of care: Progressive  Consultants:  General surgery   Objective: Vitals:   09/09/21 0530 09/09/21 1257 09/09/21 2035 09/10/21 0424  BP:  (!) 107/59 (!) 115/48 (!) 123/54  Pulse: 70 79 79 77  Resp: 17 (!) '22 19 17  '$ Temp: 98.1 F (36.7 C) 98.4 F (36.9 C) 98.4 F (36.9 C) 99.3 F (37.4 C)   TempSrc: Oral Oral Oral Oral  SpO2: 96% 99% 99% 96%  Weight:      Height:        Intake/Output Summary (Last 24 hours) at 09/10/2021 1031 Last data filed at 09/10/2021 0600 Gross per 24 hour  Intake 604.64 ml  Output 1005 ml  Net -400.36 ml    Wt Readings from Last 3 Encounters:  09/06/21 45.5 kg  12/12/18 46.3 kg  10/24/18 45.8 kg    Examination:  Constitutional: NAD Eyes: lids and conjunctivae normal, no scleral icterus ENMT: mmm Neck: normal, supple Respiratory: clear to auscultation bilaterally, no wheezing, no crackles. Normal respiratory effort.  Cardiovascular: Regular rate and rhythm, no murmurs / rubs / gallops. No LE edema. Skin: no rashes Neurologic: no focal deficits, equal strength  Data Reviewed: I have independently reviewed following labs and imaging studies   CBC Recent Labs  Lab 09/06/21 1025 09/07/21 0246 09/08/21 0804 09/09/21 0427 09/10/21 0429  WBC 23.4* 18.6* 18.0* 14.4* 16.1*  HGB 11.8* 10.7* 11.3* 10.4* 10.2*  HCT 35.8* 32.7* 34.5* 31.6* 30.9*  PLT 331 277 323 311 360  MCV 98.9 100.0 99.4 100.3* 100.7*  MCH 32.6 32.7 32.6 33.0 33.2  MCHC 33.0 32.7 32.8 32.9 33.0  RDW 13.4 13.5 13.8 14.1 14.2     Recent Labs  Lab 09/06/21 1025 09/06/21 1130 09/06/21 1649 09/06/21 1803 09/07/21 0246 09/08/21 0804 09/09/21 0427 09/10/21 0429  NA 131*  --   --   --  135 139 139 136  K 4.2  --   --   --  4.4 4.1 3.8 4.3  CL 94*  --   --   --  103 108 109 105  CO2 22  --   --   --  '22 22 22 23  '$ GLUCOSE 220*  --   --   --  251* 125* 120* 138*  BUN 75*  --   --   --  64* 43* 36* 29*  CREATININE 1.98*  --   --   --  1.48* 1.31* 1.20* 1.11*  CALCIUM 9.5  --   --   --  7.8* 7.5* 7.4* 7.1*  AST 14*  --   --   --   --  18  --   --   ALT 14  --   --   --   --  18  --   --   ALKPHOS 65  --   --   --   --  60  --   --   BILITOT 0.5  --   --   --   --  0.9  --   --   ALBUMIN 3.4*  --   --   --   --  2.2*  --   --   LATICACIDVEN  --  1.8  --   --   --    --   --   --   INR  --  1.4*  --   --   --   --   --   --  TSH  --   --  8.025*  --   --   --   --   --   HGBA1C  --   --   --  5.9*  --   --   --   --      ------------------------------------------------------------------------------------------------------------------ No results for input(s): "CHOL", "HDL", "LDLCALC", "TRIG", "CHOLHDL", "LDLDIRECT" in the last 72 hours.  Lab Results  Component Value Date   HGBA1C 5.9 (H) 09/06/2021   ------------------------------------------------------------------------------------------------------------------ No results for input(s): "TSH", "T4TOTAL", "T3FREE", "THYROIDAB" in the last 72 hours.  Invalid input(s): "FREET3"   Cardiac Enzymes No results for input(s): "CKMB", "TROPONINI", "MYOGLOBIN" in the last 168 hours.  Invalid input(s): "CK" ------------------------------------------------------------------------------------------------------------------ No results found for: "BNP"  CBG: Recent Labs  Lab 09/09/21 0719 09/09/21 1107 09/09/21 1751 09/09/21 2030 09/10/21 0716  GLUCAP 121* 178* 130* 154* 145*     Recent Results (from the past 240 hour(s))  Blood Culture (routine x 2)     Status: None (Preliminary result)   Collection Time: 09/06/21 11:23 AM   Specimen: BLOOD RIGHT FOREARM  Result Value Ref Range Status   Specimen Description   Final    BLOOD RIGHT FOREARM Performed at [facility], [address] [place], [place] [address]    Special Requests   Final    BOTTLES DRAWN AEROBIC AND ANAEROBIC Blood Culture results may not be optimal due to an inadequate volume of blood received in culture bottles Performed at [facility], [address], [place], [place] [address]    Culture   Final    NO GROWTH 4 DAYS Performed at [facility], [place] [address] [place], [place] [address]    Report Status PENDING  Incomplete  Blood Culture (routine x 2)     Status: None  (Preliminary result)   Collection Time: 09/06/21 11:30 AM   Specimen: BLOOD  Result Value Ref Range Status   Specimen Description   Final    BLOOD LEFT ANTECUBITAL Performed at [facility], [place] [address] [place], [place] [address]    Special Requests   Final    BOTTLES DRAWN AEROBIC AND ANAEROBIC Blood Culture adequate volume Performed at [facility], [address], [place], [place] [address]    Culture   Final    NO GROWTH 4 DAYS Performed at [facility], [place] [address], [place], [place] [address]    Report Status PENDING  Incomplete  Resp Panel by RT-PCR (Flu A&B, Covid) Anterior Nasal Swab     Status: None   Collection Time: 09/06/21  2:38 PM   Specimen: Anterior Nasal Swab  Result Value Ref Range Status   SARS Coronavirus 2 by RT PCR NEGATIVE NEGATIVE Final    Comment: (NOTE) SARS-CoV-2 target nucleic acids are NOT DETECTED.  The SARS-CoV-2 RNA is generally detectable in upper respiratory specimens during the acute phase of infection. The lowest concentration of SARS-CoV-2 viral copies this assay can detect is 138 copies/mL. A negative result does not preclude SARS-Cov-2 infection and should not be used as the sole basis for treatment or other patient management decisions. A negative result may occur with  improper specimen collection/handling, submission of specimen other than nasopharyngeal swab, presence of viral mutation(s) within the areas targeted by this assay, and inadequate number of viral copies(<138 copies/mL). A negative result must be combined with clinical observations, patient history, and epidemiological information. The expected result is Negative.  Fact Sheet for Patients:  EntrepreneurPulse.com.au  Fact Sheet for Healthcare Providers:  IncredibleEmployment.be  This test is no t yet approved or cleared by the [place] FDA and  has been authorized for detection and/or  diagnosis of SARS-CoV-2 by FDA under an Emergency Use Authorization (EUA). This EUA will remain  in effect (meaning this test can be used) for the duration of the COVID-19 declaration under Section 564(b)(1) of the Act, 21 U.S.C.section 360bbb-3(b)(1), unless the authorization is terminated  or revoked sooner.       Influenza A by PCR NEGATIVE NEGATIVE Final   Influenza B by PCR NEGATIVE NEGATIVE Final    Comment: (NOTE) The Xpert Xpress SARS-CoV-2/FLU/RSV plus assay is intended as an aid in the diagnosis of influenza from Nasopharyngeal swab specimens and should not be used as a sole basis for treatment. Nasal washings and aspirates are unacceptable for Xpert Xpress SARS-CoV-2/FLU/RSV testing.  Fact Sheet for Patients: EntrepreneurPulse.com.au  Fact Sheet for Healthcare Providers: IncredibleEmployment.be  This test is not yet approved or cleared by the [place] FDA and has been authorized for detection and/or diagnosis of SARS-CoV-2 by FDA under an Emergency Use Authorization (EUA). This EUA will remain in effect (meaning this test can be used) for the duration of the COVID-19 declaration under Section 564(b)(1) of the Act, 21 U.S.C. section 360bbb-3(b)(1), unless the authorization is terminated or revoked.  Performed at [facility], [address] [place], [place] [address]   MRSA Next Gen by PCR, Nasal     Status: None   Collection Time: 09/06/21  4:22 PM   Specimen: Nasal Mucosa; Nasal Swab  Result Value Ref Range Status   MRSA by PCR Next Gen NOT DETECTED NOT DETECTED Final    Comment: (NOTE) The GeneXpert MRSA Assay (FDA approved for NASAL specimens only), is one component of a comprehensive MRSA colonization surveillance program. It is not intended to diagnose MRSA infection nor to guide or monitor treatment for MRSA infections. Test performance is not FDA approved in patients less than 21  years old. Performed at [facility], [place] [address] [place], [place] [address]      Radiology Studies: No results found.   Marzetta Board, MD, PhD [facility]  Between 7 am - 7 pm I am available, please contact me via Amion (for emergencies) or Securechat (non urgent messages)  Between 7 pm - 7 am I am not available, please contact night coverage MD/APP via Amion

## 2021-09-10 NOTE — Progress Notes (Signed)
[facility] Progress Note  4 Days Post-Op  Subjective: CC:  Patient awake and alert. Her son is at bedside. She denies pain or nausea. Tolerating FLD. Walked with PT/OT.   Objective: Vital signs in last 24 hours: Temp:  [98.4 F (36.9 C)-99.3 F (37.4 C)] 99.3 F (37.4 C) (07/08 0424) Pulse Rate:  [77-79] 77 (07/08 0424) Resp:  [17-22] 17 (07/08 0424) BP: (107-123)/(48-59) 123/54 (07/08 0424) SpO2:  [96 %-99 %] 96 % (07/08 0424) Last BM Date : 09/09/21  Intake/Output from previous day: 07/07 0701 - 07/08 0700 In: 964.6 [P.O.:720; IV Piggyback:244.6] Out: 1005 [Urine:950; Drains:30; Stool:25] Intake/Output this shift: No intake/output data recorded.  PE: Gen:  Alert, NAD, pleasant Abd: Soft, mid distention, appropriately tender around incision - staples c/d/I without cellulitis, stoma viable in LLQ, there is gas a soft brown stool in ostomy pouch. Skin: warm and dry, no rashes  Psych: A&Ox3   Lab Results:  Recent Labs    09/09/21 0427 09/10/21 0429  WBC 14.4* 16.1*  HGB 10.4* 10.2*  HCT 31.6* 30.9*  PLT 311 360    BMET Recent Labs    09/09/21 0427 09/10/21 0429  NA 139 136  K 3.8 4.3  CL 109 105  CO2 22 23  GLUCOSE 120* 138*  BUN 36* 29*  CREATININE 1.20* 1.11*  CALCIUM 7.4* 7.1*    PT/INR No results for input(s): "LABPROT", "INR" in the last 72 hours.  CMP     Component Value Date/Time   NA 136 09/10/2021 0429   K 4.3 09/10/2021 0429   CL 105 09/10/2021 0429   CO2 23 09/10/2021 0429   GLUCOSE 138 (H) 09/10/2021 0429   BUN 29 (H) 09/10/2021 0429   CREATININE 1.11 (H) 09/10/2021 0429   CALCIUM 7.1 (L) 09/10/2021 0429   PROT 5.8 (L) 09/08/2021 0804   ALBUMIN 2.2 (L) 09/08/2021 0804   AST 18 09/08/2021 0804   ALT 18 09/08/2021 0804   ALKPHOS 60 09/08/2021 0804   BILITOT 0.9 09/08/2021 0804   GFRNONAA 48 (L) 09/10/2021 0429   GFRAA  07/06/2009 0420    >60        The eGFR has been calculated using the MDRD equation. This  calculation has not been validated in all clinical situations. eGFR's persistently <60 mL/min signify possible Chronic Kidney Disease.   Lipase     Component Value Date/Time   LIPASE 13 09/06/2021 1025       Studies/Results: No results found.  Anti-infectives: Anti-infectives (From admission, onward)    Start     Dose/Rate Route Frequency Ordered Stop   09/08/21 1800  piperacillin-tazobactam (ZOSYN) IVPB 3.375 g        3.375 g 12.5 mL/hr over 240 Minutes Intravenous Every 8 hours 09/08/21 1133 09/12/21 0159   09/07/21 1300  ceFEPIme (MAXIPIME) 2 g in sodium chloride 0.9 % 100 mL IVPB  Status:  Discontinued        2 g 200 mL/hr over 30 Minutes Intravenous Every 24 hours 09/06/21 1307 09/06/21 1943   09/06/21 2200  piperacillin-tazobactam (ZOSYN) IVPB 2.25 g  Status:  Discontinued        2.25 g 100 mL/hr over 30 Minutes Intravenous Every 8 hours 09/06/21 1943 09/08/21 1133   09/06/21 1230  ceFEPIme (MAXIPIME) 2 g in sodium chloride 0.9 % 100 mL IVPB        2 g 200 mL/hr over 30 Minutes Intravenous  Once 09/06/21 1229 09/06/21 1404   09/06/21 1230  metroNIDAZOLE (  FLAGYL) IVPB 500 mg        500 mg 100 mL/hr over 60 Minutes Intravenous  Once 09/06/21 1229 09/06/21 1537        Assessment/Plan Infarcted sigmoid colon  S/p exploratory laparotomy, sigmoid colectomy, end descending colostomy 09/06/21 Dr. Harlow Asa -  path c/w diverticular disease  - POD#4, afebrile, VSS, WBC 23 > 18 > 14 > 16 -will need to follow - creatinine improving 1.2 today - NG removed 7/6, tolerating FLD, advance to Soft diet and cont chocolate ensure. - WOC RN following for new ostomy - OOB, mobilize   FEN: SD, ensure, IVF per primary ID: Zosyn 7/4 >> continue for 5 days post-op.  Foley: removed 7/6, voiding spontaneously  VTE: SCD's, SQH Dispo: med-surg, adv diet   AKI - IVF per primary team Hypothyroidism HTN   LOS: 4 days    Rosario Adie, MD  [facility]

## 2021-09-11 DIAGNOSIS — K562 Volvulus: Secondary | ICD-10-CM | POA: Diagnosis not present

## 2021-09-11 LAB — CULTURE, BLOOD (ROUTINE X 2)
Culture: NO GROWTH
Culture: NO GROWTH
Special Requests: ADEQUATE

## 2021-09-11 LAB — BASIC METABOLIC PANEL
Anion gap: 9 (ref 5–15)
BUN: 27 mg/dL — ABNORMAL HIGH (ref 8–23)
CO2: 23 mmol/L (ref 22–32)
Calcium: 7.3 mg/dL — ABNORMAL LOW (ref 8.9–10.3)
Chloride: 106 mmol/L (ref 98–111)
Creatinine, Ser: 1.29 mg/dL — ABNORMAL HIGH (ref 0.44–1.00)
GFR, Estimated: 40 mL/min — ABNORMAL LOW (ref >60)
Glucose, Bld: 108 mg/dL — ABNORMAL HIGH (ref 70–99)
Potassium: 4.9 mmol/L (ref 3.5–5.1)
Sodium: 138 mmol/L (ref 135–145)

## 2021-09-11 LAB — CBC
HCT: 31.2 % — ABNORMAL LOW (ref 36.0–46.0)
Hemoglobin: 9.8 g/dL — ABNORMAL LOW (ref 12.0–15.0)
MCH: 33 pg (ref 26.0–34.0)
MCHC: 31.4 g/dL (ref 30.0–36.0)
MCV: 105.1 fL — ABNORMAL HIGH (ref 80.0–100.0)
Platelets: 357 10*3/uL (ref 150–400)
RBC: 2.97 MIL/uL — ABNORMAL LOW (ref 3.87–5.11)
RDW: 14.6 % (ref 11.5–15.5)
WBC: 16.9 10*3/uL — ABNORMAL HIGH (ref 4.0–10.5)
nRBC: 0 % (ref 0.0–0.2)

## 2021-09-11 LAB — GLUCOSE, CAPILLARY
Glucose-Capillary: 127 mg/dL — ABNORMAL HIGH (ref 70–99)
Glucose-Capillary: 174 mg/dL — ABNORMAL HIGH (ref 70–99)
Glucose-Capillary: 150 mg/dL — ABNORMAL HIGH (ref 70–99)
Glucose-Capillary: 150 mg/dL — ABNORMAL HIGH (ref 70–99)

## 2021-09-11 NOTE — Progress Notes (Signed)
[facility] Progress Note  5 Days Post-Op  Subjective: CC:  Patient awake and alert. She denies abd pain or nausea. She is having quite a bit of back pain/  Tolerating soft diet.    Objective: Vital signs in last 24 hours: Temp:  [97.6 F (36.4 C)-98.8 F (37.1 C)] 98.8 F (37.1 C) (07/09 0415) Pulse Rate:  [68-75] 75 (07/09 0415) Resp:  [16-20] 17 (07/09 0415) BP: (98-109)/(57-58) 107/57 (07/09 0415) SpO2:  [96 %-97 %] 96 % (07/09 0415) Last BM Date : 09/09/21  Intake/Output from previous day: 07/08 0701 - 07/09 0700 In: 350 [P.O.:300; IV Piggyback:50] Out: 5465 [Urine:1400; Drains:55; Stool:50] Intake/Output this shift: No intake/output data recorded.  PE: Gen:  Alert, NAD, pleasant Abd: Soft, min distention, appropriately tender around incision - staples c/d/I without cellulitis, stoma viable in LLQ, there is gas a soft brown stool in ostomy pouch. Skin: warm and dry, no rashes  JP: SS fluid   Lab Results:  Recent Labs    09/10/21 0429 09/11/21 0348  WBC 16.1* 16.9*  HGB 10.2* 9.8*  HCT 30.9* 31.2*  PLT 360 357    BMET Recent Labs    09/10/21 0429 09/11/21 0348  NA 136 138  K 4.3 4.9  CL 105 106  CO2 23 23  GLUCOSE 138* 108*  BUN 29* 27*  CREATININE 1.11* 1.29*  CALCIUM 7.1* 7.3*    PT/INR No results for input(s): "LABPROT", "INR" in the last 72 hours.  CMP     Component Value Date/Time   NA 138 09/11/2021 0348   K 4.9 09/11/2021 0348   CL 106 09/11/2021 0348   CO2 23 09/11/2021 0348   GLUCOSE 108 (H) 09/11/2021 0348   BUN 27 (H) 09/11/2021 0348   CREATININE 1.29 (H) 09/11/2021 0348   CALCIUM 7.3 (L) 09/11/2021 0348   PROT 5.8 (L) 09/08/2021 0804   ALBUMIN 2.2 (L) 09/08/2021 0804   AST 18 09/08/2021 0804   ALT 18 09/08/2021 0804   ALKPHOS 60 09/08/2021 0804   BILITOT 0.9 09/08/2021 0804   GFRNONAA 40 (L) 09/11/2021 0348   GFRAA  07/06/2009 0420    >60        The eGFR has been calculated using the MDRD equation. This  calculation has not been validated in all clinical situations. eGFR's persistently <60 mL/min signify possible Chronic Kidney Disease.   Lipase     Component Value Date/Time   LIPASE 13 09/06/2021 1025       Studies/Results: No results found.  Anti-infectives: Anti-infectives (From admission, onward)    Start     Dose/Rate Route Frequency Ordered Stop   09/08/21 1800  piperacillin-tazobactam (ZOSYN) IVPB 3.375 g        3.375 g 12.5 mL/hr over 240 Minutes Intravenous Every 8 hours 09/08/21 1133 09/12/21 0159   09/07/21 1300  ceFEPIme (MAXIPIME) 2 g in sodium chloride 0.9 % 100 mL IVPB  Status:  Discontinued        2 g 200 mL/hr over 30 Minutes Intravenous Every 24 hours 09/06/21 1307 09/06/21 1943   09/06/21 2200  piperacillin-tazobactam (ZOSYN) IVPB 2.25 g  Status:  Discontinued        2.25 g 100 mL/hr over 30 Minutes Intravenous Every 8 hours 09/06/21 1943 09/08/21 1133   09/06/21 1230  ceFEPIme (MAXIPIME) 2 g in sodium chloride 0.9 % 100 mL IVPB        2 g 200 mL/hr over 30 Minutes Intravenous  Once 09/06/21 1229 09/06/21 1404  09/06/21 1230  metroNIDAZOLE (FLAGYL) IVPB 500 mg        500 mg 100 mL/hr over 60 Minutes Intravenous  Once 09/06/21 1229 09/06/21 1537        Assessment/Plan Infarcted sigmoid colon  S/p exploratory laparotomy, sigmoid colectomy, end descending colostomy 09/06/21 Dr. Harlow Asa -  path c/w diverticular disease  - POD#5, afebrile, VSS, WBC 23 > 18 > 14 > 16 > 17 -will need to follow and possibly get abd CT to eval further  Cont Soft diet and cont chocolate ensure. - WOC RN following for new ostomy - OOB, mobilize   FEN: SD, ensure, IVF per primary ID: Zosyn 7/4 >> continue for at least 5 days post-op. Would cont longer given rising wbc Foley: removed 7/6, voiding spontaneously  VTE: SCD's, SQH Dispo: med-surg, adv diet   AKI - IVF per primary team Hypothyroidism HTN   LOS: 5 days    Rosario Adie, MD  [facility]

## 2021-09-11 NOTE — Progress Notes (Signed)
PROGRESS NOTE  Jenna Thomas MPN:[id number] DOB: 08/11/1931 DOA: 09/06/2021 PCP: Sueanne Margarita, DO   LOS: 5 days   Brief Narrative / Interim history: 88 y.o. female with medical history significant of hypothyroidism, HTN, came with worsening of abdominal pain.  CT scan of the abdomen pelvis on admission showed volvulus/malrotation, chronic obstruction versus ischemia, right-sided hydronephrosis.  General surgery consulted, she was taken urgently to the OR on 7/4 and was found to have infarcted sigmoid colon, status post sigmoid colectomy and end descending colostomy.  Subjective / 24h Interval events: Denies any abdominal pain, nausea or vomiting.  She is tolerating diet well.  No fever or chills  Assesement and Plan: Principal Problem:   Bowel obstruction (HCC) Active Problems:   AKI (acute kidney injury) ([place])   Hydronephrosis of right kidney   Acute metabolic encephalopathy   Stage 3a chronic kidney disease (CKD) (HCC)   Hypothyroidism   HTN (hypertension)   Principal problem Infarcted sigmoid colon-status post ex lap, sigmoid colectomy and end descending colostomy.  Suspect due to diverticular disease.  Appreciate general surgery follow-up.  Initially required NG tube but now this has been removed on 7/6.  Advance diet per general surgery.  Continue Zosyn, today day # 5, since WBC is increasing will not stop for now.  Surgery is considering repeat CT scan if WBC continues to go up  Active problems Right-sided hydronephrosis-possibly due to dense pelvic adhesions from colonic process.  Status post sigmoid colectomy and end colostomy on 7/4.  Creatinine has improved and has remained stable and at her baseline  Acute metabolic encephalopathy-confused postoperatively, but now much better.  Son told me that she has been having intermittent memory problems for some time. Suspect a degree of underlying dementia worsened by this acute hospitalization.  Overall stable, appears closer to  baseline  AKI on CKD 3A-baseline creatinine around 1.4.  Creatinine is at baseline this morning  Essential hypertension-at home she is on losartan/HCTZ, along with spironolactone.  Remains normotensive, continue to hold  Hypothyroidism-continue Synthroid.  TSH 8.0, hard to interpret in the setting of an acute illness.  Repeat in 3 weeks as an outpatient  Scheduled Meds:  Chlorhexidine Gluconate Cloth  6 each Topical Daily   feeding supplement  237 mL Oral TID WC   heparin  5,000 Units Subcutaneous Q12H   insulin aspart  0-9 Units Subcutaneous TID WC   levothyroxine  75 mcg Oral QAC breakfast   mouth rinse  15 mL Mouth Rinse 4 times per day   pantoprazole (PROTONIX) IV  40 mg Intravenous QHS   Continuous Infusions:  sodium chloride     piperacillin-tazobactam (ZOSYN)  IV 3.375 g (09/11/21 0821)   PRN Meds:.sodium chloride, acetaminophen **OR** acetaminophen, hydrALAZINE, ondansetron **OR** ondansetron (ZOFRAN) IV, mouth rinse, oxyCODONE  Diet Orders (From admission, onward)     Start     Ordered   09/10/21 0835  DIET SOFT Room service appropriate? Yes; Fluid consistency: Thin  Diet effective now       Question Answer Comment  Room service appropriate? Yes   Fluid consistency: Thin      09/10/21 0835            DVT prophylaxis: SCD's Start: 09/06/21 2348 heparin injection 5,000 Units Start: 09/06/21 2200   Lab Results  Component Value Date   PLT 357 09/11/2021      Code Status: Full Code  Family Communication: No family at bedside  Status is: Inpatient  Remains inpatient appropriate because: Severity  of illness  Level of care: Progressive  Consultants:  General surgery   Objective: Vitals:   09/10/21 1243 09/10/21 2052 09/11/21 0415 09/11/21 1157  BP: (!) 109/57 (!) 98/58 (!) 107/57 (!) 113/52  Pulse: 68 72 75 74  Resp: '20 16 17 '$ (!) 24  Temp: 98.4 F (36.9 C) 97.6 F (36.4 C) 98.8 F (37.1 C) (!) 97.5 F (36.4 C)  TempSrc: Oral Oral Oral Oral   SpO2: 96% 97% 96% 99%  Weight:      Height:        Intake/Output Summary (Last 24 hours) at 09/11/2021 1224 Last data filed at 09/11/2021 0600 Gross per 24 hour  Intake 350 ml  Output 1505 ml  Net -1155 ml    Wt Readings from Last 3 Encounters:  09/06/21 45.5 kg  12/12/18 46.3 kg  10/24/18 45.8 kg    Examination:  Constitutional: NAD Eyes: lids and conjunctivae normal, no scleral icterus ENMT: mmm Neck: normal, supple Respiratory: clear to auscultation bilaterally, no wheezing, no crackles. Normal respiratory effort.  Cardiovascular: Regular rate and rhythm, no murmurs / rubs / gallops. No LE edema. Abdomen: soft, no distention, no tenderness. Bowel sounds positive.  Skin: no rashes  Data Reviewed: I have independently reviewed following labs and imaging studies   CBC Recent Labs  Lab 09/07/21 0246 09/08/21 0804 09/09/21 0427 09/10/21 0429 09/11/21 0348  WBC 18.6* 18.0* 14.4* 16.1* 16.9*  HGB 10.7* 11.3* 10.4* 10.2* 9.8*  HCT 32.7* 34.5* 31.6* 30.9* 31.2*  PLT 277 323 311 360 357  MCV 100.0 99.4 100.3* 100.7* 105.1*  MCH 32.7 32.6 33.0 33.2 33.0  MCHC 32.7 32.8 32.9 33.0 31.4  RDW 13.5 13.8 14.1 14.2 14.6     Recent Labs  Lab 09/06/21 1025 09/06/21 1130 09/06/21 1649 09/06/21 1803 09/07/21 0246 09/08/21 0804 09/09/21 0427 09/10/21 0429 09/11/21 0348  NA 131*  --   --   --  135 139 139 136 138  K 4.2  --   --   --  4.4 4.1 3.8 4.3 4.9  CL 94*  --   --   --  103 108 109 105 106  CO2 22  --   --   --  '22 22 22 23 23  '$ GLUCOSE 220*  --   --   --  251* 125* 120* 138* 108*  BUN 75*  --   --   --  64* 43* 36* 29* 27*  CREATININE 1.98*  --   --   --  1.48* 1.31* 1.20* 1.11* 1.29*  CALCIUM 9.5  --   --   --  7.8* 7.5* 7.4* 7.1* 7.3*  AST 14*  --   --   --   --  18  --   --   --   ALT 14  --   --   --   --  18  --   --   --   ALKPHOS 65  --   --   --   --  60  --   --   --   BILITOT 0.5  --   --   --   --  0.9  --   --   --   ALBUMIN 3.4*  --   --   --    --  2.2*  --   --   --   LATICACIDVEN  --  1.8  --   --   --   --   --   --   --  INR  --  1.4*  --   --   --   --   --   --   --   TSH  --   --  8.025*  --   --   --   --   --   --   HGBA1C  --   --   --  5.9*  --   --   --   --   --      ------------------------------------------------------------------------------------------------------------------ No results for input(s): "CHOL", "HDL", "LDLCALC", "TRIG", "CHOLHDL", "LDLDIRECT" in the last 72 hours.  Lab Results  Component Value Date   HGBA1C 5.9 (H) 09/06/2021   ------------------------------------------------------------------------------------------------------------------ No results for input(s): "TSH", "T4TOTAL", "T3FREE", "THYROIDAB" in the last 72 hours.  Invalid input(s): "FREET3"   Cardiac Enzymes No results for input(s): "CKMB", "TROPONINI", "MYOGLOBIN" in the last 168 hours.  Invalid input(s): "CK" ------------------------------------------------------------------------------------------------------------------ No results found for: "BNP"  CBG: Recent Labs  Lab 09/10/21 0716 09/10/21 1700 09/10/21 2049 09/11/21 0717 09/11/21 1151  GLUCAP 145* 149* 152* 127* 174*     Recent Results (from the past 240 hour(s))  Blood Culture (routine x 2)     Status: None   Collection Time: 09/06/21 11:23 AM   Specimen: BLOOD RIGHT FOREARM  Result Value Ref Range Status   Specimen Description   Final    BLOOD RIGHT FOREARM Performed at [facility], [address], [place], [place] [address]    Special Requests   Final    BOTTLES DRAWN AEROBIC AND ANAEROBIC Blood Culture results may not be optimal due to an inadequate volume of blood received in culture bottles Performed at [facility], [address], [place], [place] [address]    Culture   Final    NO GROWTH 5 DAYS Performed at [facility], [place] [address], [place], [place] [address]    Report Status 09/11/2021  FINAL  Final  Blood Culture (routine x 2)     Status: None   Collection Time: 09/06/21 11:30 AM   Specimen: BLOOD  Result Value Ref Range Status   Specimen Description   Final    BLOOD LEFT ANTECUBITAL Performed at [facility], [place] [address] [place], [place] [address]    Special Requests   Final    BOTTLES DRAWN AEROBIC AND ANAEROBIC Blood Culture adequate volume Performed at [facility], [address], [place], [place] [address]    Culture   Final    NO GROWTH 5 DAYS Performed at [facility], [place] [address] [place],  [address]    Report Status 09/11/2021 FINAL  Final  Resp Panel by RT-PCR (Flu A&B, Covid) Anterior Nasal Swab     Status: None   Collection Time: 09/06/21  2:38 PM   Specimen: Anterior Nasal Swab  Result Value Ref Range Status   SARS Coronavirus 2 by RT PCR NEGATIVE NEGATIVE Final    Comment: (NOTE) SARS-CoV-2 target nucleic acids are NOT DETECTED.  The SARS-CoV-2 RNA is generally detectable in upper respiratory specimens during the acute phase of infection. The lowest concentration of SARS-CoV-2 viral copies this assay can detect is 138 copies/mL. A negative result does not preclude SARS-Cov-2 infection and should not be used as the sole basis for treatment or other patient management decisions. A negative result may occur with  improper specimen collection/handling, submission of specimen other than nasopharyngeal swab, presence of viral mutation(s) within the areas targeted by this assay, and inadequate  number of viral copies(<138 copies/mL). A negative result must be combined with clinical observations, patient history, and epidemiological information. The expected result is Negative.  Fact Sheet for Patients:  EntrepreneurPulse.com.au  Fact Sheet for Healthcare Providers:  IncredibleEmployment.be  This test is no t yet approved or cleared by the [place] FDA and  has  been authorized for detection and/or diagnosis of SARS-CoV-2 by FDA under an Emergency Use Authorization (EUA). This EUA will remain  in effect (meaning this test can be used) for the duration of the COVID-19 declaration under Section 564(b)(1) of the Act, 21 U.S.C.section 360bbb-3(b)(1), unless the authorization is terminated  or revoked sooner.       Influenza A by PCR NEGATIVE NEGATIVE Final   Influenza B by PCR NEGATIVE NEGATIVE Final    Comment: (NOTE) The Xpert Xpress SARS-CoV-2/FLU/RSV plus assay is intended as an aid in the diagnosis of influenza from Nasopharyngeal swab specimens and should not be used as a sole basis for treatment. Nasal washings and aspirates are unacceptable for Xpert Xpress SARS-CoV-2/FLU/RSV testing.  Fact Sheet for Patients: EntrepreneurPulse.com.au  Fact Sheet for Healthcare Providers: IncredibleEmployment.be  This test is not yet approved or cleared by the [place] FDA and has been authorized for detection and/or diagnosis of SARS-CoV-2 by FDA under an Emergency Use Authorization (EUA). This EUA will remain in effect (meaning this test can be used) for the duration of the COVID-19 declaration under Section 564(b)(1) of the Act, 21 U.S.C. section 360bbb-3(b)(1), unless the authorization is terminated or revoked.  Performed at [facility], [address] [place], [place] [address]   MRSA Next Gen by PCR, Nasal     Status: None   Collection Time: 09/06/21  4:22 PM   Specimen: Nasal Mucosa; Nasal Swab  Result Value Ref Range Status   MRSA by PCR Next Gen NOT DETECTED NOT DETECTED Final    Comment: (NOTE) The GeneXpert MRSA Assay (FDA approved for NASAL specimens only), is one component of a comprehensive MRSA colonization surveillance program. It is not intended to diagnose MRSA infection nor to guide or monitor treatment for MRSA infections. Test performance is not FDA approved in  patients less than 25 years old. Performed at [facility], [place] [address], [place], [place] [address]      Radiology Studies: No results found.   Marzetta Board, MD, PhD [facility]  Between 7 am - 7 pm I am available, please contact me via Amion (for emergencies) or Securechat (non urgent messages)  Between 7 pm - 7 am I am not available, please contact night coverage MD/APP via Amion

## 2021-09-12 ENCOUNTER — Inpatient Hospital Stay (HOSPITAL_COMMUNITY): Payer: Medicare Other

## 2021-09-12 DIAGNOSIS — K562 Volvulus: Secondary | ICD-10-CM | POA: Diagnosis not present

## 2021-09-12 LAB — GLUCOSE, CAPILLARY
Glucose-Capillary: 134 mg/dL — ABNORMAL HIGH (ref 70–99)
Glucose-Capillary: 135 mg/dL — ABNORMAL HIGH (ref 70–99)
Glucose-Capillary: 147 mg/dL — ABNORMAL HIGH (ref 70–99)
Glucose-Capillary: 151 mg/dL — ABNORMAL HIGH (ref 70–99)

## 2021-09-12 LAB — CBC
HCT: 31.7 % — ABNORMAL LOW (ref 36.0–46.0)
Hemoglobin: 10.1 g/dL — ABNORMAL LOW (ref 12.0–15.0)
MCH: 32.8 pg (ref 26.0–34.0)
MCHC: 31.9 g/dL (ref 30.0–36.0)
MCV: 102.9 fL — ABNORMAL HIGH (ref 80.0–100.0)
Platelets: 408 10*3/uL — ABNORMAL HIGH (ref 150–400)
RBC: 3.08 MIL/uL — ABNORMAL LOW (ref 3.87–5.11)
RDW: 14.6 % (ref 11.5–15.5)
WBC: 17 10*3/uL — ABNORMAL HIGH (ref 4.0–10.5)
nRBC: 0 % (ref 0.0–0.2)

## 2021-09-12 LAB — BASIC METABOLIC PANEL
Anion gap: 7 (ref 5–15)
BUN: 32 mg/dL — ABNORMAL HIGH (ref 8–23)
CO2: 24 mmol/L (ref 22–32)
Calcium: 7.6 mg/dL — ABNORMAL LOW (ref 8.9–10.3)
Chloride: 106 mmol/L (ref 98–111)
Creatinine, Ser: 1.3 mg/dL — ABNORMAL HIGH (ref 0.44–1.00)
GFR, Estimated: 40 mL/min — ABNORMAL LOW (ref >60)
Glucose, Bld: 130 mg/dL — ABNORMAL HIGH (ref 70–99)
Potassium: 4.6 mmol/L (ref 3.5–5.1)
Sodium: 137 mmol/L (ref 135–145)

## 2021-09-12 MED ORDER — DOCUSATE SODIUM 100 MG PO CAPS
100.0000 mg | ORAL_CAPSULE | Freq: Two times a day (BID) | ORAL | Status: DC
  Administered 2021-09-12 – 2021-09-15 (×7): 100 mg via ORAL
  Filled 2021-09-12 (×7): qty 1

## 2021-09-12 MED ORDER — IOHEXOL 300 MG/ML  SOLN
100.0000 mL | Freq: Once | INTRAMUSCULAR | Status: AC | PRN
  Administered 2021-09-12: 75 mL via INTRAVENOUS

## 2021-09-12 MED ORDER — IOHEXOL 9 MG/ML PO SOLN
ORAL | Status: AC
  Filled 2021-09-12: qty 1000

## 2021-09-12 MED ORDER — POLYETHYLENE GLYCOL 3350 17 G PO PACK: 17.0000 g | PACK | Freq: Every day | ORAL | Status: DC | PRN

## 2021-09-12 MED ORDER — IOHEXOL 9 MG/ML PO SOLN
500.0000 mL | ORAL | Status: AC
  Administered 2021-09-12 (×2): 500 mL via ORAL

## 2021-09-12 NOTE — Care Management Important Message (Signed)
Important Message  Patient Details IM Letter given to the Patient. Name: Jenna Thomas MRN: [id number] Date of Birth: October 01, 1931   Medicare Important Message Given:  Yes     Kerin Salen 09/12/2021, 11:55 AM

## 2021-09-12 NOTE — Progress Notes (Signed)
Patient ID: Jenna Thomas, female   DOB: 1931/03/23, 88 y.o.   MRN: [id number] [facility] Surgery Progress Note  6 Days Post-Op  Subjective: CC-  Up in chair playing cards with son. Having some abdominal discomfort. Denies n/v. Tolerating diet but not eating much. States that she does not feel hungry. Colostomy productive. Denies cough, CP, SOB, dysuria.  Objective: Vital signs in last 24 hours: Temp:  [97.4 F (36.3 C)-98.3 F (36.8 C)] 98.3 F (36.8 C) (07/10 0513) Pulse Rate:  [74-77] 76 (07/10 0513) Resp:  [17-24] 17 (07/10 0513) BP: (113-135)/(52-63) 127/63 (07/10 0513) SpO2:  [97 %-99 %] 97 % (07/10 0513) Last BM Date : 09/12/21  Intake/Output from previous day: 07/09 0701 - 07/10 0700 In: 50 [IV Piggyback:50] Out: 1 [Urine:700; Drains:20; Stool:100] Intake/Output this shift: No intake/output data recorded.  PE: Gen:  Alert, NAD, pleasant Abd: Soft, min distention, mild diffuse tenderness, midline cdi with staples present, stoma viable in LLQ with gas a soft brown stool in ostomy pouch. JP SS  Lab Results:  Recent Labs    09/11/21 0348 09/12/21 0403  WBC 16.9* 17.0*  HGB 9.8* 10.1*  HCT 31.2* 31.7*  PLT 357 408*   BMET Recent Labs    09/11/21 0348 09/12/21 0403  NA 138 137  K 4.9 4.6  CL 106 106  CO2 23 24  GLUCOSE 108* 130*  BUN 27* 32*  CREATININE 1.29* 1.30*  CALCIUM 7.3* 7.6*   PT/INR No results for input(s): "LABPROT", "INR" in the last 72 hours. CMP     Component Value Date/Time   NA 137 09/12/2021 0403   K 4.6 09/12/2021 0403   CL 106 09/12/2021 0403   CO2 24 09/12/2021 0403   GLUCOSE 130 (H) 09/12/2021 0403   BUN 32 (H) 09/12/2021 0403   CREATININE 1.30 (H) 09/12/2021 0403   CALCIUM 7.6 (L) 09/12/2021 0403   PROT 5.8 (L) 09/08/2021 0804   ALBUMIN 2.2 (L) 09/08/2021 0804   AST 18 09/08/2021 0804   ALT 18 09/08/2021 0804   ALKPHOS 60 09/08/2021 0804   BILITOT 0.9 09/08/2021 0804   GFRNONAA 40 (L) 09/12/2021 0403    GFRAA  07/06/2009 0420    >60        The eGFR has been calculated using the MDRD equation. This calculation has not been validated in all clinical situations. eGFR's persistently <60 mL/min signify possible Chronic Kidney Disease.   Lipase     Component Value Date/Time   LIPASE 13 09/06/2021 1025       Studies/Results: No results found.  Anti-infectives: Anti-infectives (From admission, onward)    Start     Dose/Rate Route Frequency Ordered Stop   09/08/21 1800  piperacillin-tazobactam (ZOSYN) IVPB 3.375 g        3.375 g 12.5 mL/hr over 240 Minutes Intravenous Every 8 hours 09/08/21 1133 09/14/21 2359   09/07/21 1300  ceFEPIme (MAXIPIME) 2 g in sodium chloride 0.9 % 100 mL IVPB  Status:  Discontinued        2 g 200 mL/hr over 30 Minutes Intravenous Every 24 hours 09/06/21 1307 09/06/21 1943   09/06/21 2200  piperacillin-tazobactam (ZOSYN) IVPB 2.25 g  Status:  Discontinued        2.25 g 100 mL/hr over 30 Minutes Intravenous Every 8 hours 09/06/21 1943 09/08/21 1133   09/06/21 1230  ceFEPIme (MAXIPIME) 2 g in sodium chloride 0.9 % 100 mL IVPB        2 g 200 mL/hr over  30 Minutes Intravenous  Once 09/06/21 1229 09/06/21 1404   09/06/21 1230  metroNIDAZOLE (FLAGYL) IVPB 500 mg        500 mg 100 mL/hr over 60 Minutes Intravenous  Once 09/06/21 1229 09/06/21 1537        Assessment/Plan Infarcted sigmoid colon  -POD#6 S/p exploratory laparotomy, sigmoid colectomy, end descending colostomy 09/06/21 Dr. Harlow Asa - path c/w diverticular disease  - given rising WBC will obtain CT abdomen/pelvis today - WOC RN following for new ostomy - continue soft diet - continue JP, serosanguinous - continue IV antibiotics  - OOB, mobilize   FEN: soft, ensure ID: Zosyn 7/4 >> continue for at least 5 days post-op but may need longer giving rising WBC Foley: removed 7/6, voiding spontaneously  VTE: SCD's, SQH   AKI  Hypothyroidism HTN    LOS: 6 days    Wellington Hampshire,  [facility] Surgery 09/12/2021, 11:02 AM Please see Amion for pager number during day hours 7:00am-4:30pm

## 2021-09-12 NOTE — Progress Notes (Signed)
PROGRESS NOTE  Jenna Thomas YDX:[id number] DOB: 1931-10-13 DOA: 09/06/2021 PCP: Sueanne Margarita, DO   LOS: 6 days   Brief Narrative / Interim history: 88 y.o. female with medical history significant of hypothyroidism, HTN, came with worsening of abdominal pain.  CT scan of the abdomen pelvis on admission showed volvulus/malrotation, chronic obstruction versus ischemia, right-sided hydronephrosis.  General surgery consulted, she was taken urgently to the OR on 7/4 and was found to have infarcted sigmoid colon, status post sigmoid colectomy and end descending colostomy.  Subjective / 24h Interval events: She is feeling well, no nausea or vomiting.  Some abdominal soreness  Assesement and Plan: Principal Problem:   Bowel obstruction (HCC) Active Problems:   AKI (acute kidney injury) ([place])   Hydronephrosis of right kidney   Acute metabolic encephalopathy   Stage 3a chronic kidney disease (CKD) (HCC)   Hypothyroidism   HTN (hypertension)   Principal problem Infarcted sigmoid colon-status post ex lap, sigmoid colectomy and end descending colostomy.  Suspect due to diverticular disease.  Appreciate general surgery follow-up.  Initially required NG tube but now this has been removed on 7/6.  Advance diet per general surgery.  Continue Zosyn, today day # 6.  White count continues to remain elevated, repeat CT scan today per general surgery.  Active problems Right-sided hydronephrosis-possibly due to dense pelvic adhesions from colonic process.  Status post sigmoid colectomy and end colostomy on 7/4.  Creatinine has improved and has remained stable and at her baseline  Acute metabolic encephalopathy-confused postoperatively, but now much better.  Son told me that she has been having intermittent memory problems for some time. Suspect a degree of underlying dementia worsened by this acute hospitalization.  She appears at baseline  AKI on CKD 3A-baseline creatinine around 1.4.  Creatinine  remains at baseline  Essential hypertension-at home she is on losartan/HCTZ, along with spironolactone.  Remains normotensive, continue to hold  Hypothyroidism-continue Synthroid.  TSH 8.0, hard to interpret in the setting of an acute illness.  Repeat in 3 weeks as an outpatient  Scheduled Meds:  Chlorhexidine Gluconate Cloth  6 each Topical Daily   feeding supplement  237 mL Oral TID WC   heparin  5,000 Units Subcutaneous Q12H   insulin aspart  0-9 Units Subcutaneous TID WC   iohexol  500 mL Oral Q1H   iohexol       levothyroxine  75 mcg Oral QAC breakfast   mouth rinse  15 mL Mouth Rinse 4 times per day   pantoprazole (PROTONIX) IV  40 mg Intravenous QHS   Continuous Infusions:  sodium chloride     piperacillin-tazobactam (ZOSYN)  IV 3.375 g (09/12/21 0856)   PRN Meds:.sodium chloride, acetaminophen **OR** acetaminophen, hydrALAZINE, iohexol, ondansetron **OR** ondansetron (ZOFRAN) IV, mouth rinse, oxyCODONE  Diet Orders (From admission, onward)     Start     Ordered   09/10/21 0835  DIET SOFT Room service appropriate? Yes; Fluid consistency: Thin  Diet effective now       Question Answer Comment  Room service appropriate? Yes   Fluid consistency: Thin      09/10/21 0835            DVT prophylaxis: SCD's Start: 09/06/21 2348 heparin injection 5,000 Units Start: 09/06/21 2200   Lab Results  Component Value Date   PLT 408 (H) 09/12/2021      Code Status: Full Code  Family Communication: No family at bedside  Status is: Inpatient  Remains inpatient appropriate because: Severity of  illness  Level of care: Progressive  Consultants:  General surgery   Objective: Vitals:   09/11/21 0415 09/11/21 1157 09/11/21 2042 09/12/21 0513  BP: (!) 107/57 (!) 113/52 (!) 135/53 127/63  Pulse: 75 74 77 76  Resp: 17 (!) '24 19 17  '$ Temp: 98.8 F (37.1 C) (!) 97.5 F (36.4 C) (!) 97.4 F (36.3 C) 98.3 F (36.8 C)  TempSrc: Oral Oral Oral Oral  SpO2: 96% 99% 99% 97%   Weight:      Height:        Intake/Output Summary (Last 24 hours) at 09/12/2021 1226 Last data filed at 09/12/2021 1114 Gross per 24 hour  Intake 50 ml  Output 1120 ml  Net -1070 ml    Wt Readings from Last 3 Encounters:  09/06/21 45.5 kg  12/12/18 46.3 kg  10/24/18 45.8 kg    Examination:  Constitutional: NAD Eyes: lids and conjunctivae normal, no scleral icterus ENMT: mmm Neck: normal, supple Respiratory: clear to auscultation bilaterally, no wheezing, no crackles. Normal respiratory effort.  Cardiovascular: Regular rate and rhythm, no murmurs / rubs / gallops. No LE edema. Skin: no rashes Neurologic: no focal deficits, equal strength  Data Reviewed: I have independently reviewed following labs and imaging studies   CBC Recent Labs  Lab 09/08/21 0804 09/09/21 0427 09/10/21 0429 09/11/21 0348 09/12/21 0403  WBC 18.0* 14.4* 16.1* 16.9* 17.0*  HGB 11.3* 10.4* 10.2* 9.8* 10.1*  HCT 34.5* 31.6* 30.9* 31.2* 31.7*  PLT 323 311 360 357 408*  MCV 99.4 100.3* 100.7* 105.1* 102.9*  MCH 32.6 33.0 33.2 33.0 32.8  MCHC 32.8 32.9 33.0 31.4 31.9  RDW 13.8 14.1 14.2 14.6 14.6     Recent Labs  Lab 09/06/21 1025 09/06/21 1130 09/06/21 1649 09/06/21 1803 09/07/21 0246 09/08/21 0804 09/09/21 0427 09/10/21 0429 09/11/21 0348 09/12/21 0403  NA 131*  --   --   --    < > 139 139 136 138 137  K 4.2  --   --   --    < > 4.1 3.8 4.3 4.9 4.6  CL 94*  --   --   --    < > 108 109 105 106 106  CO2 22  --   --   --    < > '22 22 23 23 24  '$ GLUCOSE 220*  --   --   --    < > 125* 120* 138* 108* 130*  BUN 75*  --   --   --    < > 43* 36* 29* 27* 32*  CREATININE 1.98*  --   --   --    < > 1.31* 1.20* 1.11* 1.29* 1.30*  CALCIUM 9.5  --   --   --    < > 7.5* 7.4* 7.1* 7.3* 7.6*  AST 14*  --   --   --   --  18  --   --   --   --   ALT 14  --   --   --   --  18  --   --   --   --   ALKPHOS 65  --   --   --   --  60  --   --   --   --   BILITOT 0.5  --   --   --   --  0.9  --   --   --    --   ALBUMIN 3.4*  --   --   --   --  2.2*  --   --   --   --   LATICACIDVEN  --  1.8  --   --   --   --   --   --   --   --   INR  --  1.4*  --   --   --   --   --   --   --   --   TSH  --   --  8.025*  --   --   --   --   --   --   --   HGBA1C  --   --   --  5.9*  --   --   --   --   --   --    < > = values in this interval not displayed.     ------------------------------------------------------------------------------------------------------------------ No results for input(s): "CHOL", "HDL", "LDLCALC", "TRIG", "CHOLHDL", "LDLDIRECT" in the last 72 hours.  Lab Results  Component Value Date   HGBA1C 5.9 (H) 09/06/2021   ------------------------------------------------------------------------------------------------------------------ No results for input(s): "TSH", "T4TOTAL", "T3FREE", "THYROIDAB" in the last 72 hours.  Invalid input(s): "FREET3"   Cardiac Enzymes No results for input(s): "CKMB", "TROPONINI", "MYOGLOBIN" in the last 168 hours.  Invalid input(s): "CK" ------------------------------------------------------------------------------------------------------------------ No results found for: "BNP"  CBG: Recent Labs  Lab 09/11/21 1151 09/11/21 1749 09/11/21 2145 09/12/21 0735 09/12/21 1105  GLUCAP 174* 150* 150* 134* 135*     Recent Results (from the past 240 hour(s))  Blood Culture (routine x 2)     Status: None   Collection Time: 09/06/21 11:23 AM   Specimen: BLOOD RIGHT FOREARM  Result Value Ref Range Status   Specimen Description   Final    BLOOD RIGHT FOREARM Performed at [facility], [address], [place], [place] [address]    Special Requests   Final    BOTTLES DRAWN AEROBIC AND ANAEROBIC Blood Culture results may not be optimal due to an inadequate volume of blood received in culture bottles Performed at [facility], [address], [place], [place] [address]    Culture   Final    NO GROWTH 5  DAYS Performed at [facility], [place] [address] [place], [place] [address]    Report Status 09/11/2021 FINAL  Final  Blood Culture (routine x 2)     Status: None   Collection Time: 09/06/21 11:30 AM   Specimen: BLOOD  Result Value Ref Range Status   Specimen Description   Final    BLOOD LEFT ANTECUBITAL Performed at [facility], [place] [address] [place], [place] [address]    Special Requests   Final    BOTTLES DRAWN AEROBIC AND ANAEROBIC Blood Culture adequate volume Performed at [facility], [address], [place], [place] [address]    Culture   Final    NO GROWTH 5 DAYS Performed at [facility], [place] [address], [place], [place] [address]    Report Status 09/11/2021 FINAL  Final  Resp Panel by RT-PCR (Flu A&B, Covid) Anterior Nasal Swab     Status: None   Collection Time: 09/06/21  2:38 PM   Specimen: Anterior Nasal Swab  Result Value Ref Range Status   SARS Coronavirus 2 by RT PCR NEGATIVE NEGATIVE Final    Comment: (NOTE) SARS-CoV-2 target nucleic acids are NOT DETECTED.  The SARS-CoV-2 RNA is generally detectable in upper respiratory specimens during the acute phase of infection. The lowest concentration of  SARS-CoV-2 viral copies this assay can detect is 138 copies/mL. A negative result does not preclude SARS-Cov-2 infection and should not be used as the sole basis for treatment or other patient management decisions. A negative result may occur with  improper specimen collection/handling, submission of specimen other than nasopharyngeal swab, presence of viral mutation(s) within the areas targeted by this assay, and inadequate number of viral copies(<138 copies/mL). A negative result must be combined with clinical observations, patient history, and epidemiological information. The expected result is Negative.  Fact Sheet for Patients:  EntrepreneurPulse.com.au  Fact Sheet for Healthcare Providers:   IncredibleEmployment.be  This test is no t yet approved or cleared by the [place] FDA and  has been authorized for detection and/or diagnosis of SARS-CoV-2 by FDA under an Emergency Use Authorization (EUA). This EUA will remain  in effect (meaning this test can be used) for the duration of the COVID-19 declaration under Section 564(b)(1) of the Act, 21 U.S.C.section 360bbb-3(b)(1), unless the authorization is terminated  or revoked sooner.       Influenza A by PCR NEGATIVE NEGATIVE Final   Influenza B by PCR NEGATIVE NEGATIVE Final    Comment: (NOTE) The Xpert Xpress SARS-CoV-2/FLU/RSV plus assay is intended as an aid in the diagnosis of influenza from Nasopharyngeal swab specimens and should not be used as a sole basis for treatment. Nasal washings and aspirates are unacceptable for Xpert Xpress SARS-CoV-2/FLU/RSV testing.  Fact Sheet for Patients: EntrepreneurPulse.com.au  Fact Sheet for Healthcare Providers: IncredibleEmployment.be  This test is not yet approved or cleared by the [place] FDA and has been authorized for detection and/or diagnosis of SARS-CoV-2 by FDA under an Emergency Use Authorization (EUA). This EUA will remain in effect (meaning this test can be used) for the duration of the COVID-19 declaration under Section 564(b)(1) of the Act, 21 U.S.C. section 360bbb-3(b)(1), unless the authorization is terminated or revoked.  Performed at [facility], [address] [place], [place] [address]   MRSA Next Gen by PCR, Nasal     Status: None   Collection Time: 09/06/21  4:22 PM   Specimen: Nasal Mucosa; Nasal Swab  Result Value Ref Range Status   MRSA by PCR Next Gen NOT DETECTED NOT DETECTED Final    Comment: (NOTE) The GeneXpert MRSA Assay (FDA approved for NASAL specimens only), is one component of a comprehensive MRSA colonization surveillance program. It is not intended  to diagnose MRSA infection nor to guide or monitor treatment for MRSA infections. Test performance is not FDA approved in patients less than 76 years old. Performed at [facility], [place] [address] [place], [place] [address]      Radiology Studies: No results found.   Marzetta Board, MD, PhD [facility]  Between 7 am - 7 pm I am available, please contact me via Amion (for emergencies) or Securechat (non urgent messages)  Between 7 pm - 7 am I am not available, please contact night coverage MD/APP via Amion

## 2021-09-13 DIAGNOSIS — K562 Volvulus: Secondary | ICD-10-CM | POA: Diagnosis not present

## 2021-09-13 LAB — CBC
HCT: 31.9 % — ABNORMAL LOW (ref 36.0–46.0)
Hemoglobin: 10.3 g/dL — ABNORMAL LOW (ref 12.0–15.0)
MCH: 32.9 pg (ref 26.0–34.0)
MCHC: 32.3 g/dL (ref 30.0–36.0)
MCV: 101.9 fL — ABNORMAL HIGH (ref 80.0–100.0)
Platelets: 443 10*3/uL — ABNORMAL HIGH (ref 150–400)
RBC: 3.13 MIL/uL — ABNORMAL LOW (ref 3.87–5.11)
RDW: 14.6 % (ref 11.5–15.5)
WBC: 17.2 10*3/uL — ABNORMAL HIGH (ref 4.0–10.5)
nRBC: 0 % (ref 0.0–0.2)

## 2021-09-13 LAB — BASIC METABOLIC PANEL
Anion gap: 7 (ref 5–15)
BUN: 28 mg/dL — ABNORMAL HIGH (ref 8–23)
CO2: 26 mmol/L (ref 22–32)
Calcium: 7.9 mg/dL — ABNORMAL LOW (ref 8.9–10.3)
Chloride: 102 mmol/L (ref 98–111)
Creatinine, Ser: 1.46 mg/dL — ABNORMAL HIGH (ref 0.44–1.00)
GFR, Estimated: 34 mL/min — ABNORMAL LOW (ref >60)
Glucose, Bld: 126 mg/dL — ABNORMAL HIGH (ref 70–99)
Potassium: 5 mmol/L (ref 3.5–5.1)
Sodium: 135 mmol/L (ref 135–145)

## 2021-09-13 LAB — GLUCOSE, CAPILLARY
Glucose-Capillary: 133 mg/dL — ABNORMAL HIGH (ref 70–99)
Glucose-Capillary: 141 mg/dL — ABNORMAL HIGH (ref 70–99)
Glucose-Capillary: 108 mg/dL — ABNORMAL HIGH (ref 70–99)
Glucose-Capillary: 140 mg/dL — ABNORMAL HIGH (ref 70–99)

## 2021-09-13 MED ORDER — PIPERACILLIN-TAZOBACTAM IN DEX 2-0.25 GM/50ML IV SOLN
2.2500 g | Freq: Three times a day (TID) | INTRAVENOUS | Status: AC
  Administered 2021-09-13 – 2021-09-14 (×5): 2.25 g via INTRAVENOUS
  Filled 2021-09-13 (×5): qty 50

## 2021-09-13 NOTE — Progress Notes (Signed)
PROGRESS NOTE  Jenna Thomas EXB:[id number] DOB: 30-Apr-1931 DOA: 09/06/2021 PCP: Sueanne Margarita, DO   LOS: 7 days   Brief Narrative / Interim history: 88 y.o. female with medical history significant of hypothyroidism, HTN, came with worsening of abdominal pain.  CT scan of the abdomen pelvis on admission showed volvulus/malrotation, chronic obstruction versus ischemia, right-sided hydronephrosis.  General surgery consulted, she was taken urgently to the OR on 7/4 and was found to have infarcted sigmoid colon, status post sigmoid colectomy and end descending colostomy.  Subjective / 24h Interval events: Feeling well, no nausea or vomiting.  Assesement and Plan: Principal Problem:   Bowel obstruction (HCC) Active Problems:   AKI (acute kidney injury) ([place])   Hydronephrosis of right kidney   Acute metabolic encephalopathy   Stage 3a chronic kidney disease (CKD) (HCC)   Hypothyroidism   HTN (hypertension)   Principal problem Infarcted sigmoid colon-status post ex lap, sigmoid colectomy and end descending colostomy.  Suspect due to diverticular disease.  Appreciate general surgery follow-up.  Initially required NG tube but now this has been removed on 7/6.  Advance diet per general surgery, now tolerating a soft diet.  Completing 7 days of Zosyn today.  Due to persistently elevated WBC, underwent a repeat CT scan on 7/10 without acute findings/abscesses.  CT did show a degree of ileus however she is clinically eating and passing stool  Active problems Right-sided hydronephrosis-possibly due to dense pelvic adhesions from colonic process.  Status post sigmoid colectomy and end colostomy on 7/4.  Creatinine has improved and overall has remained stable and at her baseline.  Hydronephrosis resolved on the CT scan yesterday  Acute metabolic encephalopathy-confused postoperatively, but now much better.  Son told me that she has been having intermittent memory problems for some time. Suspect a  degree of underlying dementia worsened by this acute hospitalization.  Currently appears at baseline, appropriate, alert and oriented x4  AKI on CKD 3A-baseline creatinine around 1.4.  Creatinine remains at baseline  Essential hypertension-at home she is on losartan/HCTZ, along with spironolactone.  Remains normotensive, continue to hold  Hypothyroidism-continue Synthroid.  TSH 8.0, hard to interpret in the setting of an acute illness.  Repeat in 3 weeks as an outpatient  Scheduled Meds:  docusate sodium  100 mg Oral BID   feeding supplement  237 mL Oral TID WC   heparin  5,000 Units Subcutaneous Q12H   insulin aspart  0-9 Units Subcutaneous TID WC   levothyroxine  75 mcg Oral QAC breakfast   mouth rinse  15 mL Mouth Rinse 4 times per day   pantoprazole (PROTONIX) IV  40 mg Intravenous QHS   Continuous Infusions:  sodium chloride     piperacillin-tazobactam (ZOSYN)  IV     PRN Meds:.sodium chloride, acetaminophen **OR** acetaminophen, hydrALAZINE, ondansetron **OR** ondansetron (ZOFRAN) IV, mouth rinse, oxyCODONE, polyethylene glycol  Diet Orders (From admission, onward)     Start     Ordered   09/10/21 0835  DIET SOFT Room service appropriate? Yes; Fluid consistency: Thin  Diet effective now       Question Answer Comment  Room service appropriate? Yes   Fluid consistency: Thin      09/10/21 0835            DVT prophylaxis: SCD's Start: 09/06/21 2348 heparin injection 5,000 Units Start: 09/06/21 2200   Lab Results  Component Value Date   PLT 443 (H) 09/13/2021      Code Status: Full Code  Family Communication: No  family at bedside  Status is: Inpatient  Remains inpatient appropriate because: Severity of illness  Level of care: Progressive  Consultants:  General surgery   Objective: Vitals:   09/12/21 1246 09/12/21 2143 09/13/21 0529 09/13/21 1237  BP: 123/60 (!) 151/85 139/63 129/74  Pulse: 74 85 77 70  Resp: '16 17 20 16  '$ Temp: 97.6 F (36.4 C) 98.1  F (36.7 C) 98.6 F (37 C) 98 F (36.7 C)  TempSrc: Oral Oral Oral Oral  SpO2: 99% 100% 97% 97%  Weight:      Height:        Intake/Output Summary (Last 24 hours) at 09/13/2021 1253 Last data filed at 09/13/2021 1229 Gross per 24 hour  Intake 840 ml  Output 2045 ml  Net -1205 ml    Wt Readings from Last 3 Encounters:  09/06/21 45.5 kg  12/12/18 46.3 kg  10/24/18 45.8 kg    Examination:  Constitutional: NAD Eyes: lids and conjunctivae normal, no scleral icterus ENMT: mmm Neck: normal, supple Respiratory: Normal respiratory effort.  Cardiovascular: No edema Skin: no rashes Neurologic: Grossly nonfocal  Data Reviewed: I have independently reviewed following labs and imaging studies   CBC Recent Labs  Lab 09/09/21 0427 09/10/21 0429 09/11/21 0348 09/12/21 0403 09/13/21 0409  WBC 14.4* 16.1* 16.9* 17.0* 17.2*  HGB 10.4* 10.2* 9.8* 10.1* 10.3*  HCT 31.6* 30.9* 31.2* 31.7* 31.9*  PLT 311 360 357 408* 443*  MCV 100.3* 100.7* 105.1* 102.9* 101.9*  MCH 33.0 33.2 33.0 32.8 32.9  MCHC 32.9 33.0 31.4 31.9 32.3  RDW 14.1 14.2 14.6 14.6 14.6     Recent Labs  Lab 09/06/21 1649 09/06/21 1803 09/07/21 0246 09/08/21 0804 09/09/21 0427 09/10/21 0429 09/11/21 0348 09/12/21 0403 09/13/21 0409  NA  --   --    < > 139 139 136 138 137 135  K  --   --    < > 4.1 3.8 4.3 4.9 4.6 5.0  CL  --   --    < > 108 109 105 106 106 102  CO2  --   --    < > '22 22 23 23 24 26  '$ GLUCOSE  --   --    < > 125* 120* 138* 108* 130* 126*  BUN  --   --    < > 43* 36* 29* 27* 32* 28*  CREATININE  --   --    < > 1.31* 1.20* 1.11* 1.29* 1.30* 1.46*  CALCIUM  --   --    < > 7.5* 7.4* 7.1* 7.3* 7.6* 7.9*  AST  --   --   --  18  --   --   --   --   --   ALT  --   --   --  18  --   --   --   --   --   ALKPHOS  --   --   --  60  --   --   --   --   --   BILITOT  --   --   --  0.9  --   --   --   --   --   ALBUMIN  --   --   --  2.2*  --   --   --   --   --   TSH 8.025*  --   --   --   --   --    --   --   --  HGBA1C  --  5.9*  --   --   --   --   --   --   --    < > = values in this interval not displayed.     ------------------------------------------------------------------------------------------------------------------ No results for input(s): "CHOL", "HDL", "LDLCALC", "TRIG", "CHOLHDL", "LDLDIRECT" in the last 72 hours.  Lab Results  Component Value Date   HGBA1C 5.9 (H) 09/06/2021   ------------------------------------------------------------------------------------------------------------------ No results for input(s): "TSH", "T4TOTAL", "T3FREE", "THYROIDAB" in the last 72 hours.  Invalid input(s): "FREET3"   Cardiac Enzymes No results for input(s): "CKMB", "TROPONINI", "MYOGLOBIN" in the last 168 hours.  Invalid input(s): "CK" ------------------------------------------------------------------------------------------------------------------ No results found for: "BNP"  CBG: Recent Labs  Lab 09/12/21 1105 09/12/21 1606 09/12/21 2124 09/13/21 0728 09/13/21 1102  GLUCAP 135* 147* 151* 133* 141*     Recent Results (from the past 240 hour(s))  Blood Culture (routine x 2)     Status: None   Collection Time: 09/06/21 11:23 AM   Specimen: BLOOD RIGHT FOREARM  Result Value Ref Range Status   Specimen Description   Final    BLOOD RIGHT FOREARM Performed at [facility], [address], [place], [place] [address]    Special Requests   Final    BOTTLES DRAWN AEROBIC AND ANAEROBIC Blood Culture results may not be optimal due to an inadequate volume of blood received in culture bottles Performed at [facility], [address] [place], [place] [address]    Culture   Final    NO GROWTH 5 DAYS Performed at [facility], [place] [address] [place], [place] [address]    Report Status 09/11/2021 FINAL  Final  Blood Culture (routine x 2)     Status: None   Collection Time: 09/06/21 11:30 AM   Specimen: BLOOD  Result  Value Ref Range Status   Specimen Description   Final    BLOOD LEFT ANTECUBITAL Performed at [facility], [place] [address] [place], [place] [address]    Special Requests   Final    BOTTLES DRAWN AEROBIC AND ANAEROBIC Blood Culture adequate volume Performed at [facility], [address], [place], [place] [address]    Culture   Final    NO GROWTH 5 DAYS Performed at [facility], [place] [address] [place], [place] [address]    Report Status 09/11/2021 FINAL  Final  Resp Panel by RT-PCR (Flu A&B, Covid) Anterior Nasal Swab     Status: None   Collection Time: 09/06/21  2:38 PM   Specimen: Anterior Nasal Swab  Result Value Ref Range Status   SARS Coronavirus 2 by RT PCR NEGATIVE NEGATIVE Final    Comment: (NOTE) SARS-CoV-2 target nucleic acids are NOT DETECTED.  The SARS-CoV-2 RNA is generally detectable in upper respiratory specimens during the acute phase of infection. The lowest concentration of SARS-CoV-2 viral copies this assay can detect is 138 copies/mL. A negative result does not preclude SARS-Cov-2 infection and should not be used as the sole basis for treatment or other patient management decisions. A negative result may occur with  improper specimen collection/handling, submission of specimen other than nasopharyngeal swab, presence of viral mutation(s) within the areas targeted by this assay, and inadequate number of viral copies(<138 copies/mL). A negative result must be combined with clinical observations, patient history, and epidemiological information. The expected result is Negative.  Fact Sheet for Patients:  EntrepreneurPulse.com.au  Fact Sheet for Healthcare Providers:  IncredibleEmployment.be  This test is  no t yet approved or cleared by the [place] and  has been authorized for detection and/or diagnosis of SARS-CoV-2 by FDA under an Emergency Use Authorization (EUA). This EUA will  remain  in effect (meaning this test can be used) for the duration of the COVID-19 declaration under Section 564(b)(1) of the Act, 21 U.S.C.section 360bbb-3(b)(1), unless the authorization is terminated  or revoked sooner.       Influenza A by PCR NEGATIVE NEGATIVE Final   Influenza B by PCR NEGATIVE NEGATIVE Final    Comment: (NOTE) The Xpert Xpress SARS-CoV-2/FLU/RSV plus assay is intended as an aid in the diagnosis of influenza from Nasopharyngeal swab specimens and should not be used as a sole basis for treatment. Nasal washings and aspirates are unacceptable for Xpert Xpress SARS-CoV-2/FLU/RSV testing.  Fact Sheet for Patients: EntrepreneurPulse.com.au  Fact Sheet for Healthcare Providers: IncredibleEmployment.be  This test is not yet approved or cleared by the [place] FDA and has been authorized for detection and/or diagnosis of SARS-CoV-2 by FDA under an Emergency Use Authorization (EUA). This EUA will remain in effect (meaning this test can be used) for the duration of the COVID-19 declaration under Section 564(b)(1) of the Act, 21 U.S.C. section 360bbb-3(b)(1), unless the authorization is terminated or revoked.  Performed at [facility], [address], [place], [place] [address]   MRSA Next Gen by PCR, Nasal     Status: None   Collection Time: 09/06/21  4:22 PM   Specimen: Nasal Mucosa; Nasal Swab  Result Value Ref Range Status   MRSA by PCR Next Gen NOT DETECTED NOT DETECTED Final    Comment: (NOTE) The GeneXpert MRSA Assay (FDA approved for NASAL specimens only), is one component of a comprehensive MRSA colonization surveillance program. It is not intended to diagnose MRSA infection nor to guide or monitor treatment for MRSA infections. Test performance is not FDA approved in patients less than 35 years old. Performed at [facility], [place] [address] [place], [place] [address]       Radiology Studies: CT ABDOMEN PELVIS W CONTRAST  Result Date: 09/12/2021 CLINICAL DATA:  Abdominal pain. One week postop from sigmoid colectomy and end colostomy. EXAM: CT ABDOMEN AND PELVIS WITH CONTRAST TECHNIQUE: Multidetector CT imaging of the abdomen and pelvis was performed using the standard protocol following bolus administration of intravenous contrast. RADIATION DOSE REDUCTION: This exam was performed according to the departmental dose-optimization program which includes automated exposure control, adjustment of the mA and/or kV according to patient size and/or use of iterative reconstruction technique. CONTRAST:  61m OMNIPAQUE IOHEXOL 300 MG/ML  SOLN COMPARISON:  Noncontrast CT on 09/06/2021 FINDINGS: Lower Chest: Mild right basilar atelectasis and tiny right pleural effusion. Hepatobiliary: No hepatic masses identified. Gallbladder is unremarkable. No evidence of biliary ductal dilatation. Pancreas:  No mass or inflammatory changes. Spleen: Within normal limits in size and appearance. Adrenals/Urinary Tract: No masses identified. Tiny bilateral renal cysts incidentally noted (no followup imaging recommended). No evidence of ureteral calculi or hydronephrosis. Bladder largely obscured by severe by artifact from bilateral hip prostheses. Stomach/Bowel: Postop changes are seen from sigmoid colon resection with left lower quadrant colostomy. Surgical drain is seen within the pelvis. No evidence of abscess or free intraperitoneal air. Multiple mild-to-moderately dilated small bowel loops are seen without definite transition zone, likely due to postop ileus. Vascular/Lymphatic: No pathologically enlarged lymph nodes. No acute vascular findings. Aortic atherosclerotic calcification incidentally noted. Reproductive: Obscured by severe artifact from bilateral hip prostheses. Other:  None. Musculoskeletal:  No suspicious bone lesions identified. IMPRESSION: Postop changes from sigmoid colon resection  with left lower quadrant colostomy. No evidence of abscess or free intraperitoneal air. Multiple mild-to-moderately dilated small bowel loops, likely due to postop ileus. Mild right basilar atelectasis and tiny right pleural effusion. Aortic Atherosclerosis (ICD10-I70.0). Electronically Signed   By: Marlaine Hind M.D.   On: 09/12/2021 14:42     Marzetta Board, MD, PhD [facility]  Between 7 am - 7 pm I am available, please contact me via Amion (for emergencies) or Securechat (non urgent messages)  Between 7 pm - 7 am I am not available, please contact night coverage MD/APP via Amion

## 2021-09-13 NOTE — Consult Note (Signed)
[facility] Nurse ostomy follow up Stoma type/location: LLQ, end stoma  Stomal assessment/size: 1 `1/2" round, budded, pink, moist Peristomal assessment: intact  Treatment options for stomal/peristomal skin: 2" skin barrier ring  Output liquid dark stool  Ostomy pouching: 2pc. 2 1/4" with 2" skin barrier ring  Education provided:  Patient is engaged but seems to have some dementia. Son in the room and reports that the patient will be staying in the wellness portion of [facility] when she returns and after that they will have 12 hour/day CNA private duty arranged. However he reports CNA's can not change the ostomy pouch.  He questions how often to be changed, reported it will need to be changed at least 2x per week.  Observed pouch change again. 5 barrier/pouches/barrier rings left in room. Family had taken all other supplies/pattern/educational materials from the room. Son does verbalize general steps.   Enrolled patient in [place] Start Discharge program: Yes Discussed with [facility] staff as well.   [place] Nurse will follow along with you for continued support with ostomy teaching and care Valentine MSN, RN, [place], [place], [place]

## 2021-09-13 NOTE — Progress Notes (Signed)
Patient ID: Jenna Thomas, female   DOB: 1931-04-13, 88 y.o.   MRN: [id number] [facility] Surgery Progress Note  7 Days Post-Op  Subjective: CC-  Son at bedside. Up in chair. Denies much abdominal pain. Denies n/v. Tolerating what she is eating but does not have a big appetite. Likes ensure.  Objective: Vital signs in last 24 hours: Temp:  [97.6 F (36.4 C)-98.6 F (37 C)] 98.6 F (37 C) (07/11 0529) Pulse Rate:  [74-85] 77 (07/11 0529) Resp:  [16-20] 20 (07/11 0529) BP: (123-151)/(60-85) 139/63 (07/11 0529) SpO2:  [97 %-100 %] 97 % (07/11 0529) Last BM Date : 09/13/21  Intake/Output from previous day: 07/10 0701 - 07/11 0700 In: 720 [P.O.:720] Out: 1945 [Urine:1900; Drains:20; Stool:25] Intake/Output this shift: Total I/O In: 120 [P.O.:120] Out: -   PE: Gen:  Alert, NAD, pleasant Abd: Soft, min distention, mild diffuse tenderness, midline cdi with staples present, stoma viable in LLQ with gas a soft brown stool in ostomy pouch. JP serous  Lab Results:  Recent Labs    09/12/21 0403 09/13/21 0409  WBC 17.0* 17.2*  HGB 10.1* 10.3*  HCT 31.7* 31.9*  PLT 408* 443*   BMET Recent Labs    09/12/21 0403 09/13/21 0409  NA 137 135  K 4.6 5.0  CL 106 102  CO2 24 26  GLUCOSE 130* 126*  BUN 32* 28*  CREATININE 1.30* 1.46*  CALCIUM 7.6* 7.9*   PT/INR No results for input(s): "LABPROT", "INR" in the last 72 hours. CMP     Component Value Date/Time   NA 135 09/13/2021 0409   K 5.0 09/13/2021 0409   CL 102 09/13/2021 0409   CO2 26 09/13/2021 0409   GLUCOSE 126 (H) 09/13/2021 0409   BUN 28 (H) 09/13/2021 0409   CREATININE 1.46 (H) 09/13/2021 0409   CALCIUM 7.9 (L) 09/13/2021 0409   PROT 5.8 (L) 09/08/2021 0804   ALBUMIN 2.2 (L) 09/08/2021 0804   AST 18 09/08/2021 0804   ALT 18 09/08/2021 0804   ALKPHOS 60 09/08/2021 0804   BILITOT 0.9 09/08/2021 0804   GFRNONAA 34 (L) 09/13/2021 0409   GFRAA  07/06/2009 0420    >60        The eGFR has been  calculated using the MDRD equation. This calculation has not been validated in all clinical situations. eGFR's persistently <60 mL/min signify possible Chronic Kidney Disease.   Lipase     Component Value Date/Time   LIPASE 13 09/06/2021 1025       Studies/Results: CT ABDOMEN PELVIS W CONTRAST  Result Date: 09/12/2021 CLINICAL DATA:  Abdominal pain. One week postop from sigmoid colectomy and end colostomy. EXAM: CT ABDOMEN AND PELVIS WITH CONTRAST TECHNIQUE: Multidetector CT imaging of the abdomen and pelvis was performed using the standard protocol following bolus administration of intravenous contrast. RADIATION DOSE REDUCTION: This exam was performed according to the departmental dose-optimization program which includes automated exposure control, adjustment of the mA and/or kV according to patient size and/or use of iterative reconstruction technique. CONTRAST:  71m OMNIPAQUE IOHEXOL 300 MG/ML  SOLN COMPARISON:  Noncontrast CT on 09/06/2021 FINDINGS: Lower Chest: Mild right basilar atelectasis and tiny right pleural effusion. Hepatobiliary: No hepatic masses identified. Gallbladder is unremarkable. No evidence of biliary ductal dilatation. Pancreas:  No mass or inflammatory changes. Spleen: Within normal limits in size and appearance. Adrenals/Urinary Tract: No masses identified. Tiny bilateral renal cysts incidentally noted (no followup imaging recommended). No evidence of ureteral calculi or hydronephrosis. Bladder largely obscured  by severe by artifact from bilateral hip prostheses. Stomach/Bowel: Postop changes are seen from sigmoid colon resection with left lower quadrant colostomy. Surgical drain is seen within the pelvis. No evidence of abscess or free intraperitoneal air. Multiple mild-to-moderately dilated small bowel loops are seen without definite transition zone, likely due to postop ileus. Vascular/Lymphatic: No pathologically enlarged lymph nodes. No acute vascular findings.  Aortic atherosclerotic calcification incidentally noted. Reproductive: Obscured by severe artifact from bilateral hip prostheses. Other:  None. Musculoskeletal:  No suspicious bone lesions identified. IMPRESSION: Postop changes from sigmoid colon resection with left lower quadrant colostomy. No evidence of abscess or free intraperitoneal air. Multiple mild-to-moderately dilated small bowel loops, likely due to postop ileus. Mild right basilar atelectasis and tiny right pleural effusion. Aortic Atherosclerosis (ICD10-I70.0). Electronically Signed   By: Marlaine Hind M.D.   On: 09/12/2021 14:42    Anti-infectives: Anti-infectives (From admission, onward)    Start     Dose/Rate Route Frequency Ordered Stop   09/13/21 1400  piperacillin-tazobactam (ZOSYN) IVPB 2.25 g        2.25 g 100 mL/hr over 30 Minutes Intravenous Every 8 hours 09/13/21 1003 09/15/21 0559   09/08/21 1800  piperacillin-tazobactam (ZOSYN) IVPB 3.375 g  Status:  Discontinued        3.375 g 12.5 mL/hr over 240 Minutes Intravenous Every 8 hours 09/08/21 1133 09/13/21 1002   09/07/21 1300  ceFEPIme (MAXIPIME) 2 g in sodium chloride 0.9 % 100 mL IVPB  Status:  Discontinued        2 g 200 mL/hr over 30 Minutes Intravenous Every 24 hours 09/06/21 1307 09/06/21 1943   09/06/21 2200  piperacillin-tazobactam (ZOSYN) IVPB 2.25 g  Status:  Discontinued        2.25 g 100 mL/hr over 30 Minutes Intravenous Every 8 hours 09/06/21 1943 09/08/21 1133   09/06/21 1230  ceFEPIme (MAXIPIME) 2 g in sodium chloride 0.9 % 100 mL IVPB        2 g 200 mL/hr over 30 Minutes Intravenous  Once 09/06/21 1229 09/06/21 1404   09/06/21 1230  metroNIDAZOLE (FLAGYL) IVPB 500 mg        500 mg 100 mL/hr over 60 Minutes Intravenous  Once 09/06/21 1229 09/06/21 1537        Assessment/Plan Infarcted sigmoid colon  -POD#7 S/p exploratory laparotomy, sigmoid colectomy, end descending colostomy 09/06/21 Dr. Harlow Asa - path c/w diverticular disease  - CT 7/10 showed  mild ileus, no other intraabdominal complications or explanation for leukocytosis - any further work up per primary - tolerating diet and colostomy functioning - ok to d/c abx after today from surgical standpoint - d/c JP drain today - WOC RN following for new ostomy - OOB, mobilize   FEN: soft, ensure ID: Zosyn 7/4 >>  Foley: removed 7/6, voiding spontaneously  VTE: SCD's, SQH   AKI  Hypothyroidism HTN    LOS: 7 days    Wellington [place], [facility] 09/13/2021, 11:12 AM Please see Amion for pager number during day hours 7:00am-4:30pm

## 2021-09-13 NOTE — Progress Notes (Signed)
Physical Therapy Treatment Patient Details Name: Jenna Thomas MRN: [id number] DOB: 1932/02/25 Today's Date: 09/13/2021   History of Present Illness 88 y.o. female with medical history significant of hypothyroidism, HTN, macular degeneration.  Pt presented with worsening of abdominal pain.  CT scan of the abdomen pelvis on admission showed volvulus/malrotation, chronic obstruction versus ischemia, right-sided hydronephrosis.  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    PT Comments    Progressing with mobility. Moderate pain, back>abdomen. Pt reports her family is making arrangements for discharge. She is planning to go to the skilled section of [place] for a few days. Will continue to follow.    Recommendations for follow up therapy are one component of a multi-disciplinary discharge planning process, led by the attending physician.  Recommendations may be updated based on patient status, additional functional criteria and insurance authorization.  Follow Up Recommendations  Home health PT (pt reported she is going to the skilled section of [place] for a few days)     Assistance Recommended at Discharge Frequent or constant Supervision/Assistance  Patient can return home with the following A little help with walking and/or transfers;A little help with bathing/dressing/bathroom;Direct supervision/assist for medications management;Assistance with cooking/housework   Equipment Recommendations  None recommended by PT    Recommendations for Other Services       Precautions / Restrictions Precautions Precautions: Fall Precaution Comments: colostomy, Restrictions Weight Bearing Restrictions: No     Mobility  Bed Mobility Overal bed mobility: Needs Assistance Bed Mobility: Supine to Sit     Supine to sit: Min guard, HOB elevated      General bed mobility comments: HOB elevated. Pt chose to come up into long sit before moving to EOB    Transfers Overall transfer level: Needs assistance Equipment used: Rolling walker (2 wheels) Transfers: Sit to/from Stand Sit to Stand: Min guard           General transfer comment: Cues for safety, hand placement.    Ambulation/Gait Ambulation/Gait assistance: Min guard Gait Distance (Feet): 155 Feet Assistive device: Rolling walker (2 wheels) Gait Pattern/deviations: Step-through pattern, Decreased stride length       General Gait Details: Min guard for safety. Slow gait speed.   Stairs             Wheelchair Mobility    Modified Rankin (Stroke Patients Only)       Balance Overall balance assessment: Mild deficits observed, not formally tested                                          Cognition Arousal/Alertness: Awake/alert Behavior During Therapy: WFL for tasks assessed/performed Overall Cognitive Status: Within Functional Limits for tasks assessed                                          Exercises      General Comments        Pertinent Vitals/Pain Pain Assessment Pain Assessment: Faces Faces Pain Scale: Hurts even more Pain Location: back> abdomen Pain Descriptors / Indicators: Discomfort, Sore Pain Intervention(s): Monitored during session    Home Living  Prior Function            PT Goals (current goals can now be found in the care plan section) Progress towards PT goals: Progressing toward goals    Frequency    Min 3X/week      PT Plan Current plan remains appropriate    Co-evaluation              AM-PAC PT "6 Clicks" Mobility   Outcome Measure  Help needed turning from your back to your side while in a flat bed without using bedrails?: A Little Help needed moving from lying on your back to sitting on the side of a flat bed without using  bedrails?: A Little Help needed moving to and from a bed to a chair (including a wheelchair)?: A Little Help needed standing up from a chair using your arms (e.g., wheelchair or bedside chair)?: A Little Help needed to walk in hospital room?: A Little Help needed climbing 3-5 steps with a railing? : A Lot 6 Click Score: 17    End of Session   Activity Tolerance: Patient limited by pain;Patient tolerated treatment well Patient left: in chair;with call bell/phone within reach   PT Visit Diagnosis: Difficulty in walking, not elsewhere classified (R26.2)     Time: 2233-6122 PT Time Calculation (min) (ACUTE ONLY): 30 min  Charges:  $Gait Training: 23-37 mins                         Doreatha Massed, PT Acute Rehabilitation  Office: [phone] Pager: [phone]

## 2021-09-13 NOTE — TOC Progression Note (Signed)
Transition of Care [facility]) - Progression Note    Patient Details  Name: MALLARY KREGER MRN: [id number] Date of Birth: 01/19/1932  [facility]) CM/SW Contact  Leeroy Cha, RN Phone Number: 09/13/2021, 8:18 AM  Clinical Narrative:    [phone] chart reviewed.  Following for toc needs.  Plan is to return home with self-care at this time.   Expected Discharge Plan: Home/Self Care Barriers to Discharge: Continued Medical Work up  Expected Discharge Plan and Services Expected Discharge Plan: Home/Self Care   Discharge Planning Services: CM Consult   Living arrangements for the past 2 months: [place] ([facility])                                       Social Determinants of Health (SDOH) Interventions    Readmission Risk Interventions     No data to display

## 2021-09-13 NOTE — Progress Notes (Signed)
OT Cancellation Note  Patient Details Name: Jenna Thomas MRN: [id number] DOB: 1932-02-19   Cancelled Treatment:    Reason Eval/Treat Not Completed: Patient declined, no reason specified Patient declined stating she had just gotten back from walk with nursing 3rd time today. Patient asked for therapy to come back tomorrow AM. OT to continue to follow and check back tomorrow AM.  Jackelyn Poling OTR/L, MS Acute Rehabilitation Department Office# [phone] Pager# [phone]  09/13/2021, 3:52 PM

## 2021-09-13 NOTE — Progress Notes (Signed)
PHARMACY NOTE:  ANTIMICROBIAL RENAL DOSAGE ADJUSTMENT  Current antimicrobial regimen includes a mismatch between antimicrobial dosage and estimated renal function.  As per policy approved by the Pharmacy & Therapeutics and Medical Executive Committees, the antimicrobial dosage will be adjusted accordingly.  Current antimicrobial dosage:  piperacillin/tazobactam 3.375 g IV q8h EI  Indication: Intra-abdominal infection  Renal Function:  Estimated Creatinine Clearance: 19.1 mL/min (A) (by C-G formula based on SCr of 1.46 mg/dL (H)).  Antimicrobial dosage has been changed to:  Piperacillin/tazobactam 2.25 g IV q8h  Additional comments: Kept previously ordered stop date of 7/12  Lenis Noon, PharmD, BCPS 09/13/21 10:06 AM

## 2021-09-13 NOTE — Progress Notes (Signed)
Patient ambulated around 180 feet in hallway with only standby assist, using RW. Pt tolerated well and is currently sitting up in chair.

## 2021-09-14 DIAGNOSIS — K562 Volvulus: Secondary | ICD-10-CM | POA: Diagnosis not present

## 2021-09-14 LAB — URINALYSIS, ROUTINE W REFLEX MICROSCOPIC
Bilirubin Urine: NEGATIVE
Glucose, UA: NEGATIVE mg/dL
Hgb urine dipstick: NEGATIVE
Ketones, ur: NEGATIVE mg/dL
Leukocytes,Ua: NEGATIVE
Nitrite: NEGATIVE
Protein, ur: NEGATIVE mg/dL
Specific Gravity, Urine: 1.023 (ref 1.005–1.030)
pH: 5 (ref 5.0–8.0)

## 2021-09-14 LAB — CBC
HCT: 34 % — ABNORMAL LOW (ref 36.0–46.0)
Hemoglobin: 11.2 g/dL — ABNORMAL LOW (ref 12.0–15.0)
MCH: 32.9 pg (ref 26.0–34.0)
MCHC: 32.9 g/dL (ref 30.0–36.0)
MCV: 100 fL (ref 80.0–100.0)
Platelets: 576 10*3/uL — ABNORMAL HIGH (ref 150–400)
RBC: 3.4 MIL/uL — ABNORMAL LOW (ref 3.87–5.11)
RDW: 14.6 % (ref 11.5–15.5)
WBC: 16.2 10*3/uL — ABNORMAL HIGH (ref 4.0–10.5)
nRBC: 0 % (ref 0.0–0.2)

## 2021-09-14 LAB — BASIC METABOLIC PANEL
Anion gap: 11 (ref 5–15)
BUN: 31 mg/dL — ABNORMAL HIGH (ref 8–23)
CO2: 25 mmol/L (ref 22–32)
Calcium: 9.1 mg/dL (ref 8.9–10.3)
Chloride: 102 mmol/L (ref 98–111)
Creatinine, Ser: 1.72 mg/dL — ABNORMAL HIGH (ref 0.44–1.00)
GFR, Estimated: 28 mL/min — ABNORMAL LOW (ref >60)
Glucose, Bld: 139 mg/dL — ABNORMAL HIGH (ref 70–99)
Potassium: 4.6 mmol/L (ref 3.5–5.1)
Sodium: 138 mmol/L (ref 135–145)

## 2021-09-14 LAB — GLUCOSE, CAPILLARY
Glucose-Capillary: 150 mg/dL — ABNORMAL HIGH (ref 70–99)
Glucose-Capillary: 161 mg/dL — ABNORMAL HIGH (ref 70–99)
Glucose-Capillary: 150 mg/dL — ABNORMAL HIGH (ref 70–99)
Glucose-Capillary: 139 mg/dL — ABNORMAL HIGH (ref 70–99)

## 2021-09-14 MED ORDER — SODIUM CHLORIDE 0.9 % IV SOLN: INTRAVENOUS | Status: DC

## 2021-09-14 MED ORDER — ALUM & MAG HYDROXIDE-SIMETH 200-200-20 MG/5ML PO SUSP
30.0000 mL | ORAL | Status: DC | PRN
  Administered 2021-09-14: 30 mL via ORAL
  Filled 2021-09-14: qty 30

## 2021-09-14 MED ORDER — METOCLOPRAMIDE HCL 5 MG/ML IJ SOLN
5.0000 mg | Freq: Four times a day (QID) | INTRAMUSCULAR | Status: DC | PRN
  Administered 2021-09-14: 5 mg via INTRAVENOUS
  Filled 2021-09-14 (×2): qty 2

## 2021-09-14 NOTE — TOC Progression Note (Signed)
Transition of Care [facility]) - Progression Note    Patient Details  Name: MAYLEEN BORRERO MRN: [id number] Date of Birth: January 09, 1932  Transition of [facility]) CM/SW Contact  Leeroy Cha, RN Phone Number: 09/14/2021, 9:49 AM  Clinical Narrative:    Fl2 sent to [facility] for prep for pending dc.   Expected Discharge Plan: Home/Self Care Barriers to Discharge: Continued Medical Work up  Expected Discharge Plan and Services Expected Discharge Plan: Home/Self Care   Discharge Planning Services: CM Consult   Living arrangements for the past 2 months: [place] ([facility])                                       Social Determinants of Health (SDOH) Interventions    Readmission Risk Interventions     No data to display

## 2021-09-14 NOTE — NC FL2 (Signed)
[place] LEVEL OF CARE SCREENING TOOL     IDENTIFICATION  Patient Name: Jenna Thomas Birthdate: [birth year] Sex: female Admission Date (Current Location): 09/06/2021  [facility] and [place] Number:  Herbalist and Address:  [facility],  [place] [place], [place]      Provider Number: [id number]  Attending Physician Name and Address:  Georgette Shell, MD  Relative Name and Phone Number:       Current Level of Care: Hospital Recommended Level of Care: [place] Prior Approval Number:    Date Approved/Denied:   PASRR Number: [id number]  Discharge Plan: SNF    Current Diagnoses: Patient Active Problem List   Diagnosis Date Noted   Hydronephrosis of right kidney 85/27/7824   Acute metabolic encephalopathy 23/53/6144   Stage 3a chronic kidney disease (CKD) ([place]) 09/08/2021   Hypothyroidism 09/08/2021   HTN (hypertension) 09/08/2021   Bowel obstruction ([place]) 09/06/2021   AKI (acute kidney injury) ([place]) 09/06/2021    Orientation RESPIRATION BLADDER Height & Weight     Self, Time, Situation, Place  Normal Continent Weight: 45.5 kg Height:  '5\' 5"'$  (165.1 cm)  BEHAVIORAL SYMPTOMS/MOOD NEUROLOGICAL BOWEL NUTRITION STATUS      Continent Diet (regular)  AMBULATORY STATUS COMMUNICATION OF NEEDS Skin   Extensive Assist Verbally Surgical wounds                       Personal Care Assistance Level of Assistance  Bathing, Feeding, Dressing Bathing Assistance: Limited assistance Feeding assistance: Limited assistance Dressing Assistance: Limited assistance     Functional Limitations Info  Sight, Hearing, Speech Sight Info: Adequate Hearing Info: Adequate Speech Info: Adequate    SPECIAL CARE FACTORS FREQUENCY  PT (By licensed PT), OT (By licensed OT)     PT Frequency: 5 x weekly OT Frequency: 5 x weekly            Contractures Contractures Info: Not present    Additional Factors Info   Code Status Code Status Info: full             Current Medications (09/14/2021):  This is the current hospital active medication list Current Facility-Administered Medications  Medication Dose Route Frequency Provider Last Rate Last Admin   0.9 %  sodium chloride infusion   Intravenous PRN Armandina Gemma, MD       0.9 %  sodium chloride infusion   Intravenous Continuous Georgette Shell, MD       acetaminophen (TYLENOL) tablet 650 mg  650 mg Oral Q6H PRN Armandina Gemma, MD   650 mg at 09/13/21 1648   Or   acetaminophen (TYLENOL) suppository 650 mg  650 mg Rectal Q6H PRN Armandina Gemma, MD       docusate sodium (COLACE) capsule 100 mg  100 mg Oral BID Meuth, Brooke A, PA-C   100 mg at 09/13/21 2229   feeding supplement (ENSURE ENLIVE / ENSURE PLUS) liquid 237 mL  237 mL Oral TID WC Jill Alexanders, PA-C   237 mL at 09/14/21 0850   heparin injection 5,000 Units  5,000 Units Subcutaneous Q12H Armandina Gemma, MD   5,000 Units at 09/13/21 2229   hydrALAZINE (APRESOLINE) injection 5 mg  5 mg Intravenous Q6H PRN Armandina Gemma, MD       insulin aspart (novoLOG) injection 0-9 Units  0-9 Units Subcutaneous TID WC Armandina Gemma, MD   1 Units at 09/14/21 0850   levothyroxine (SYNTHROID) tablet 75  mcg  75 mcg Oral QAC breakfast Armandina Gemma, MD   75 mcg at 09/14/21 0529   metoCLOPramide (REGLAN) injection 5 mg  5 mg Intravenous Q6H PRN Georgette Shell, MD       ondansetron (ZOFRAN-ODT) disintegrating tablet 4 mg  4 mg Oral Q6H PRN Armandina Gemma, MD       Or   ondansetron [facility]) injection 4 mg  4 mg Intravenous Q6H PRN Armandina Gemma, MD   4 mg at 09/14/21 0532   Oral care mouth rinse  15 mL Mouth Rinse 4 times per day Armandina Gemma, MD   15 mL at 09/13/21 2228   Oral care mouth rinse  15 mL Mouth Rinse PRN Armandina Gemma, MD       oxyCODONE (Oxy IR/ROXICODONE) immediate release tablet 5 mg  5 mg Oral Q6H PRN Caren Griffins, MD   5 mg at 09/12/21 1927   pantoprazole (PROTONIX) injection 40 mg  40 mg  Intravenous Teresita Madura, MD   40 mg at 09/13/21 2228   piperacillin-tazobactam (ZOSYN) IVPB 2.25 g  2.25 g Intravenous Q8H Lenis Noon, RPH 100 mL/hr at 09/14/21 0529 2.25 g at 09/14/21 0529   polyethylene glycol (MIRALAX / GLYCOLAX) packet 17 g  17 g Oral Daily PRN Wellington Hampshire, PA-C         Discharge Medications: Please see discharge summary for a list of discharge medications.  Relevant Imaging Results:  Relevant Lab Results:   Additional Information [id number]  Leeroy Cha, RN

## 2021-09-14 NOTE — Progress Notes (Signed)
Occupational Therapy Treatment Patient Details Name: Jenna Thomas MRN: [id number] DOB: 1932-02-08 Today's Date: 09/14/2021   History of present illness 88 y.o. female with medical history significant of hypothyroidism, HTN, macular degeneration.  Pt presented with worsening of abdominal pain.  CT scan of the abdomen pelvis on admission showed volvulus/malrotation, chronic obstruction versus ischemia, right-sided hydronephrosis.  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   OT comments  Patient was noted to have improved in LB dressing tasks since last OT session with patient able to engage in figure four positioning of BLE. Patient and son were educated on reacher and walker trays. Patient and son verbalized understanding. Patient's discharge plan remains appropriate at this time. OT will continue to follow acutely.     Recommendations for follow up therapy are one component of a multi-disciplinary discharge planning process, led by the attending physician.  Recommendations may be updated based on patient status, additional functional criteria and insurance authorization.    Follow Up Recommendations  No OT follow up    Assistance Recommended at Discharge Intermittent Supervision/Assistance  Patient can return home with the following  A little help with bathing/dressing/bathroom;Assistance with cooking/housework;Direct supervision/assist for medications management;Direct supervision/assist for financial management   Equipment Recommendations  None recommended by OT    Recommendations for Other Services      Precautions / Restrictions Precautions Precautions: Fall Precaution Comments: colostomy, Restrictions Weight Bearing Restrictions: No       Mobility Bed Mobility               General bed mobility comments: patient was up in recliner  and returned to the same.    Transfers                         Balance                                           ADL either performed or assessed with clinical judgement   ADL Overall ADL's : Needs assistance/impaired                       Lower Body Dressing Details (indicate cue type and reason): patient was supervision to bring bilateral legs up to lap seated in recliner with figure four positioning. Toilet Transfer: Min guard;Rolling walker (2 wheels);Ambulation;Regular Toilet   Toileting- Water quality scientist and Hygiene: Min guard;Sitting/lateral lean         General ADL Comments: Patient and son in room were educated on reacher use. patient's son reported having one at home. patient and son were educated on walker tray as well. patients son verbalized understanding.    Extremity/Trunk Assessment              Information systems manager Instructions       General Comments  Pertinent Vitals/ Pain       Pain Assessment Pain Assessment: No/denies pain  Home Living                                          Prior Functioning/Environment              Frequency  Min 2X/week        Progress Toward Goals  OT Goals(current goals can now be found in the care plan section)  Progress towards OT goals: Progressing toward goals     Plan Discharge plan remains appropriate    Co-evaluation                 AM-PAC OT "6 Clicks" Daily Activity     Outcome Measure   Help from another person eating meals?: None Help from another person taking care of personal grooming?: A Little Help from another person toileting, which includes using toliet, bedpan, or urinal?: A Little Help from another person bathing (including washing, rinsing, drying)?: A Little Help from another  person to put on and taking off regular upper body clothing?: A Little Help from another person to put on and taking off regular lower body clothing?: A Little 6 Click Score: 19    End of Session Equipment Utilized During Treatment: Rolling walker (2 wheels)  OT Visit Diagnosis: Muscle weakness (generalized) (M62.81)   Activity Tolerance Patient tolerated treatment well   Patient Left in chair;with call bell/phone within reach;with family/visitor present   Nurse Communication Mobility status        Time: 9833-8250 OT Time Calculation (min): 16 min  Charges: OT General Charges $OT Visit: 1 Visit OT Treatments $Self Care/Home Management : 8-22 mins  Jackelyn Poling OTR/L, MS Acute Rehabilitation Department Office# [phone] Pager# [phone]   Marcellina Millin 09/14/2021, 10:58 AM

## 2021-09-14 NOTE — Discharge Instructions (Signed)
**  Please remove all abdominal staples on 09/20/21 and apply interrupted steri strips. Call surgeon's office if any issues.  [facility], [place] [phone]  OPEN ABDOMINAL SURGERY: POST OP INSTRUCTIONS  Always review your discharge instruction sheet given to you by the facility where your surgery was performed.  IF YOU HAVE DISABILITY OR FAMILY LEAVE FORMS, YOU MUST BRING THEM TO THE OFFICE FOR PROCESSING.  PLEASE DO NOT GIVE THEM TO YOUR DOCTOR.  A prescription for pain medication may be given to you upon discharge.  Take your pain medication as prescribed, if needed.  If narcotic pain medicine is not needed, then you may take acetaminophen (Tylenol) or ibuprofen (Advil) as needed. Take your usually prescribed medications unless otherwise directed. If you need a refill on your pain medication, please contact your pharmacy. They will contact our office to request authorization.  Prescriptions will not be filled after 5pm or on week-ends. You should follow a light diet the first few days after arrival home, such as soup and crackers, pudding, etc.unless your doctor has advised otherwise. A high-fiber, low fat diet can be resumed as tolerated.   Be sure to include lots of fluids daily. Most patients will experience some swelling and bruising on the chest and neck area.  Ice packs will help.  Swelling and bruising can take several days to resolve Most patients will experience some swelling and bruising in the area of the incision. Ice pack will help. Swelling and bruising can take several days to resolve..  It is common to experience some constipation if taking pain medication after surgery.  Increasing fluid intake and taking a stool softener will usually help or prevent this problem from occurring.  A mild laxative (Milk of Magnesia or Miralax) should be taken according to package directions if there are no bowel movements after 48 hours.  You may find that a light gauze bandage  over your incision may keep your staples from being rubbed or pulled. You may shower and replace the bandage daily. ACTIVITIES:  You may resume regular (light) daily activities beginning the next day--such as daily self-care, walking, climbing stairs--gradually increasing activities as tolerated.  You may have sexual intercourse when it is comfortable.  Refrain from any heavy lifting or straining until approved by your doctor. You may drive when you no longer are taking prescription pain medication, you can comfortably wear a seatbelt, and you can safely maneuver your car and apply brakes You should see your doctor in the office for a follow-up appointment approximately two weeks after your surgery.  Make sure that you call for this appointment within a day or two after you arrive home to insure a convenient appointment time.  WHEN TO CALL YOUR DOCTOR: Fever over 101.0 Inability to urinate Nausea and/or vomiting Extreme swelling or bruising Continued bleeding from incision. Increased pain, redness, or drainage from the incision. Difficulty swallowing or breathing Muscle cramping or spasms. Numbness or tingling in hands or feet or around lips.  The clinic staff is available to answer your questions during regular business hours.  Please don't hesitate to call and ask to speak to one of the nurses if you have concerns.  For further questions, please visit [email or web address]

## 2021-09-14 NOTE — Progress Notes (Signed)
Patient ID: Jenna Thomas, female   DOB: 25-Nov-1931, 88 y.o.   MRN: [id number] [facility] Surgery Progress Note  8 Days Post-Op  Subjective: CC-  Still not very hungry, but denies significant abdominal pain, nausea, or vomiting. Drank 2 ensure yesterday and did well with breakfast and lunch, did not eat much for dinner. WBC trending down, afebrile. Planning to go to rehab at [facility].  Objective: Vital signs in last 24 hours: Temp:  [97.7 F (36.5 C)-98.6 F (37 C)] 98.6 F (37 C) (07/12 0750) Pulse Rate:  [70-87] 87 (07/12 0750) Resp:  [16-18] 17 (07/12 0750) BP: (126-130)/(64-74) 129/64 (07/12 0750) SpO2:  [94 %-98 %] 94 % (07/12 0750) Last BM Date : 09/14/21  Intake/Output from previous day: 07/11 0701 - 07/12 0700 In: 637.3 [P.O.:480; IV Piggyback:157.3] Out: 1150 [Urine:1150] Intake/Output this shift: Total I/O In: -  Out: 100 [Stool:100]  PE: Gen:  Alert, NAD, pleasant Abd: Soft, min distention, nontender, midline cdi with staples present, stoma viable in LLQ with gas a soft brown stool in ostomy pouch, previous JP site with cdi dressing  Lab Results:  Recent Labs    09/13/21 0409 09/14/21 0423  WBC 17.2* 16.2*  HGB 10.3* 11.2*  HCT 31.9* 34.0*  PLT 443* 576*   BMET Recent Labs    09/13/21 0409 09/14/21 0423  NA 135 138  K 5.0 4.6  CL 102 102  CO2 26 25  GLUCOSE 126* 139*  BUN 28* 31*  CREATININE 1.46* 1.72*  CALCIUM 7.9* 9.1   PT/INR No results for input(s): "LABPROT", "INR" in the last 72 hours. CMP     Component Value Date/Time   NA 138 09/14/2021 0423   K 4.6 09/14/2021 0423   CL 102 09/14/2021 0423   CO2 25 09/14/2021 0423   GLUCOSE 139 (H) 09/14/2021 0423   BUN 31 (H) 09/14/2021 0423   CREATININE 1.72 (H) 09/14/2021 0423   CALCIUM 9.1 09/14/2021 0423   PROT 5.8 (L) 09/08/2021 0804   ALBUMIN 2.2 (L) 09/08/2021 0804   AST 18 09/08/2021 0804   ALT 18 09/08/2021 0804   ALKPHOS 60 09/08/2021 0804   BILITOT 0.9 09/08/2021  0804   GFRNONAA 28 (L) 09/14/2021 0423   GFRAA  07/06/2009 0420    >60        The eGFR has been calculated using the MDRD equation. This calculation has not been validated in all clinical situations. eGFR's persistently <60 mL/min signify possible Chronic Kidney Disease.   Lipase     Component Value Date/Time   LIPASE 13 09/06/2021 1025       Studies/Results: CT ABDOMEN PELVIS W CONTRAST  Result Date: 09/12/2021 CLINICAL DATA:  Abdominal pain. One week postop from sigmoid colectomy and end colostomy. EXAM: CT ABDOMEN AND PELVIS WITH CONTRAST TECHNIQUE: Multidetector CT imaging of the abdomen and pelvis was performed using the standard protocol following bolus administration of intravenous contrast. RADIATION DOSE REDUCTION: This exam was performed according to the departmental dose-optimization program which includes automated exposure control, adjustment of the mA and/or kV according to patient size and/or use of iterative reconstruction technique. CONTRAST:  57m OMNIPAQUE IOHEXOL 300 MG/ML  SOLN COMPARISON:  Noncontrast CT on 09/06/2021 FINDINGS: Lower Chest: Mild right basilar atelectasis and tiny right pleural effusion. Hepatobiliary: No hepatic masses identified. Gallbladder is unremarkable. No evidence of biliary ductal dilatation. Pancreas:  No mass or inflammatory changes. Spleen: Within normal limits in size and appearance. Adrenals/Urinary Tract: No masses identified. Tiny bilateral renal cysts incidentally  noted (no followup imaging recommended). No evidence of ureteral calculi or hydronephrosis. Bladder largely obscured by severe by artifact from bilateral hip prostheses. Stomach/Bowel: Postop changes are seen from sigmoid colon resection with left lower quadrant colostomy. Surgical drain is seen within the pelvis. No evidence of abscess or free intraperitoneal air. Multiple mild-to-moderately dilated small bowel loops are seen without definite transition zone, likely due to  postop ileus. Vascular/Lymphatic: No pathologically enlarged lymph nodes. No acute vascular findings. Aortic atherosclerotic calcification incidentally noted. Reproductive: Obscured by severe artifact from bilateral hip prostheses. Other:  None. Musculoskeletal:  No suspicious bone lesions identified. IMPRESSION: Postop changes from sigmoid colon resection with left lower quadrant colostomy. No evidence of abscess or free intraperitoneal air. Multiple mild-to-moderately dilated small bowel loops, likely due to postop ileus. Mild right basilar atelectasis and tiny right pleural effusion. Aortic Atherosclerosis (ICD10-I70.0). Electronically Signed   By: Marlaine Hind M.D.   On: 09/12/2021 14:42    Anti-infectives: Anti-infectives (From admission, onward)    Start     Dose/Rate Route Frequency Ordered Stop   09/13/21 1400  piperacillin-tazobactam (ZOSYN) IVPB 2.25 g        2.25 g 100 mL/hr over 30 Minutes Intravenous Every 8 hours 09/13/21 1003 09/15/21 0559   09/08/21 1800  piperacillin-tazobactam (ZOSYN) IVPB 3.375 g  Status:  Discontinued        3.375 g 12.5 mL/hr over 240 Minutes Intravenous Every 8 hours 09/08/21 1133 09/13/21 1002   09/07/21 1300  ceFEPIme (MAXIPIME) 2 g in sodium chloride 0.9 % 100 mL IVPB  Status:  Discontinued        2 g 200 mL/hr over 30 Minutes Intravenous Every 24 hours 09/06/21 1307 09/06/21 1943   09/06/21 2200  piperacillin-tazobactam (ZOSYN) IVPB 2.25 g  Status:  Discontinued        2.25 g 100 mL/hr over 30 Minutes Intravenous Every 8 hours 09/06/21 1943 09/08/21 1133   09/06/21 1230  ceFEPIme (MAXIPIME) 2 g in sodium chloride 0.9 % 100 mL IVPB        2 g 200 mL/hr over 30 Minutes Intravenous  Once 09/06/21 1229 09/06/21 1404   09/06/21 1230  metroNIDAZOLE (FLAGYL) IVPB 500 mg        500 mg 100 mL/hr over 60 Minutes Intravenous  Once 09/06/21 1229 09/06/21 1537        Assessment/Plan Infarcted sigmoid colon  -POD#8 S/p exploratory laparotomy, sigmoid  colectomy, end descending colostomy 09/06/21 Dr. Harlow Asa - path c/w diverticular disease  - CT 7/10 showed mild ileus, no other intraabdominal complications or explanation for leukocytosis - tolerating diet and colostomy functioning - no need for further abx from surgical standpoint - JP drain removed 7/11 - WOC RN following for new ostomy - OOB, mobilize - ok for discharge today from surgical standpoint. Discharge instructions and follow up info on AVS   FEN: soft, ensure ID: Zosyn 7/4 >>  Foley: removed 7/6, voiding spontaneously VTE: SCD's, SQH   AKI - Cr slightly up today, WBC trending down but still 16. Will check u/a Hypothyroidism HTN    LOS: 8 days    Wellington Hampshire, [facility] Surgery 09/14/2021, 9:29 AM Please see Amion for pager number during day hours 7:00am-4:30pm

## 2021-09-14 NOTE — Progress Notes (Signed)
Educated patient on importance of mobility, remaining OOB during the day.  Provided teach back education on colostomy care and emptying.  Patient verbalized understanding.  Patient c/o nausea refractory to Zofran, MD notified, see new orders.  Angie Fava, RN

## 2021-09-14 NOTE — Progress Notes (Signed)
PROGRESS NOTE    Jenna Thomas  DQQ:[id number] DOB: 1931/10/19 DOA: 09/06/2021 PCP: Sueanne Margarita, DO   Brief Narrative: 88 y.o. female with medical history significant of hypothyroidism, HTN, came with worsening of abdominal pain.  CT scan of the abdomen pelvis on admission showed volvulus/malrotation, chronic obstruction versus ischemia, right-sided hydronephrosis.  General surgery consulted, she was taken urgently to the OR on 7/4 and was found to have infarcted sigmoid colon, status post sigmoid colectomy and end descending colostomy.  7/12 c/o nausea not releived with zofran   Assessment & Plan:   Principal Problem:   Bowel obstruction (HCC) Active Problems:   AKI (acute kidney injury) ([place])   Hydronephrosis of right kidney   Acute metabolic encephalopathy   Stage 3a chronic kidney disease (CKD) (HCC)   Hypothyroidism   HTN (hypertension)    Infarcted sigmoid colon-status post ex lap, sigmoid colectomy and end descending colostomy.  Suspect due to diverticular disease.  Appreciate general surgery follow-up.  Initially required NG tube but now this has been removed on 7/6.  Advance diet per general surgery, now tolerating a soft diet.  Completing 7 days of Zosyn 7/11. Due to persistently elevated WBC, underwent a repeat CT scan on 7/10 without acute findings/abscesses.  CT did show a degree of ileus now with nausea  Added reglan    Active problems Right-sided hydronephrosis-possibly due to dense pelvic adhesions from colonic process.  Status post sigmoid colectomy and end colostomy on 7/4.  Creatinine has improved and overall has remained stable and at her baseline.  Hydronephrosis resolved on the CT scan repeated   Acute metabolic encephalopathy-confused postoperatively, but now much better.  Son told me that she has been having intermittent memory problems for some time. Suspect a degree of underlying dementia worsened by this acute hospitalization.    AKI on CKD  3A-baseline creatinine around 1.4.  Creatinine  increased to 1.7 patient was nauseous overnight and this morning without improvement with Zofran We will start normal saline 100 cc an hour for 1 L and recheck labs.   Essential hypertension-at home she is on losartan/HCTZ, along with spironolactone.  Remains normotensive, continue to hold  Hypothyroidism-continue Synthroid.  TSH 8.0, hard to interpret in the setting of an acute illness.  Repeat in 3 weeks as an outpatient    Estimated body mass index is 16.69 kg/m as calculated from the following:   Height as of this encounter: '5\' 5"'$  (1.651 m).   Weight as of this encounter: 45.5 kg.  DVT prophylaxis: scd heparin Code Status:  full Family Communication:  none  Disposition Plan:  Status is: Inpatient Remains inpatient appropriate because: needs ivf for aki   Consultants:  surgery  Procedures: Sigmoid colectomy and colostomy Antimicrobials: None  Subjective: Complains of nausea no vomiting  Objective: Vitals:   09/13/21 1237 09/13/21 2047 09/14/21 0441 09/14/21 0750  BP: 129/74 130/68 126/69 129/64  Pulse: 70 74 83 87  Resp: '16 18 18 17  '$ Temp: 98 F (36.7 C) 97.7 F (36.5 C) 97.8 F (36.6 C) 98.6 F (37 C)  TempSrc: Oral Oral Oral Oral  SpO2: 97% 98% 98% 94%  Weight:      Height:        Intake/Output Summary (Last 24 hours) at 09/14/2021 1415 Last data filed at 09/14/2021 1405 Gross per 24 hour  Intake 837.25 ml  Output 950 ml  Net -112.75 ml   Filed Weights   09/06/21 1010 09/06/21 1628  Weight: 48.1 kg 45.5 kg  Examination:  General exam: Appears calm and comfortable  Respiratory system: Clear to auscultation. Respiratory effort normal. Cardiovascular system: S1 & S2 heard, RRR. No JVD, murmurs, rubs, gallops or clicks. No pedal edema. Gastrointestinal system: Abdomen is nondistended, soft and tender. No organomegaly or masses felt. Normal bowel sounds heard.  Colostomy in place with dark stools Central  nervous system: Alert and oriented. No focal neurological deficits. Extremities: Symmetric 5 x 5 power. Skin: No rashes, lesions or ulcers Psychiatry: Judgement and insight appear normal. Mood & affect appropriate.     Data Reviewed: I have personally reviewed following labs and imaging studies  CBC: Recent Labs  Lab 09/10/21 0429 09/11/21 0348 09/12/21 0403 09/13/21 0409 09/14/21 0423  WBC 16.1* 16.9* 17.0* 17.2* 16.2*  HGB 10.2* 9.8* 10.1* 10.3* 11.2*  HCT 30.9* 31.2* 31.7* 31.9* 34.0*  MCV 100.7* 105.1* 102.9* 101.9* 100.0  PLT 360 357 408* 443* 338*   Basic Metabolic Panel: Recent Labs  Lab 09/10/21 0429 09/11/21 0348 09/12/21 0403 09/13/21 0409 09/14/21 0423  NA 136 138 137 135 138  K 4.3 4.9 4.6 5.0 4.6  CL 105 106 106 102 102  CO2 '23 23 24 26 25  '$ GLUCOSE 138* 108* 130* 126* 139*  BUN 29* 27* 32* 28* 31*  CREATININE 1.11* 1.29* 1.30* 1.46* 1.72*  CALCIUM 7.1* 7.3* 7.6* 7.9* 9.1   GFR: Estimated Creatinine Clearance: 16.2 mL/min (A) (by C-G formula based on SCr of 1.72 mg/dL (H)). Liver Function Tests: Recent Labs  Lab 09/08/21 0804  AST 18  ALT 18  ALKPHOS 60  BILITOT 0.9  PROT 5.8*  ALBUMIN 2.2*   No results for input(s): "LIPASE", "AMYLASE" in the last 168 hours. No results for input(s): "AMMONIA" in the last 168 hours. Coagulation Profile: No results for input(s): "INR", "PROTIME" in the last 168 hours. Cardiac Enzymes: No results for input(s): "CKTOTAL", "CKMB", "CKMBINDEX", "TROPONINI" in the last 168 hours. BNP (last 3 results) No results for input(s): "PROBNP" in the last 8760 hours. HbA1C: No results for input(s): "HGBA1C" in the last 72 hours. CBG: Recent Labs  Lab 09/13/21 1102 09/13/21 1634 09/13/21 1944 09/14/21 0748 09/14/21 1201  GLUCAP 141* 108* 140* 150* 161*   Lipid Profile: No results for input(s): "CHOL", "HDL", "LDLCALC", "TRIG", "CHOLHDL", "LDLDIRECT" in the last 72 hours. Thyroid Function Tests: No results for  input(s): "TSH", "T4TOTAL", "FREET4", "T3FREE", "THYROIDAB" in the last 72 hours. Anemia Panel: No results for input(s): "VITAMINB12", "FOLATE", "FERRITIN", "TIBC", "IRON", "RETICCTPCT" in the last 72 hours. Sepsis Labs: No results for input(s): "PROCALCITON", "LATICACIDVEN" in the last 168 hours.  Recent Results (from the past 240 hour(s))  Blood Culture (routine x 2)     Status: None   Collection Time: 09/06/21 11:23 AM   Specimen: BLOOD RIGHT FOREARM  Result Value Ref Range Status   Specimen Description   Final    BLOOD RIGHT FOREARM Performed at [facility], [address] [place], [place] [address]    Special Requests   Final    BOTTLES DRAWN AEROBIC AND ANAEROBIC Blood Culture results may not be optimal due to an inadequate volume of blood received in culture bottles Performed at [facility], [address] [place], [place] [address]    Culture   Final    NO GROWTH 5 DAYS Performed at [facility], [place] [address] [place], [place] [address]    Report Status 09/11/2021 FINAL  Final  Blood Culture (routine x 2)     Status:  None   Collection Time: 09/06/21 11:30 AM   Specimen: BLOOD  Result Value Ref Range Status   Specimen Description   Final    BLOOD LEFT ANTECUBITAL Performed at [facility], [place] [address], [place], [place] [address]    Special Requests   Final    BOTTLES DRAWN AEROBIC AND ANAEROBIC Blood Culture adequate volume Performed at [facility], [address] [place], [place] [address]    Culture   Final    NO GROWTH 5 DAYS Performed at [facility], [place] [address], [place], [place] [address]    Report Status 09/11/2021 FINAL  Final  Resp Panel by RT-PCR (Flu A&B, Covid) Anterior Nasal Swab     Status: None   Collection Time: 09/06/21  2:38 PM   Specimen: Anterior Nasal Swab  Result Value Ref Range Status   SARS Coronavirus 2 by RT PCR NEGATIVE NEGATIVE Final    Comment:  (NOTE) SARS-CoV-2 target nucleic acids are NOT DETECTED.  The SARS-CoV-2 RNA is generally detectable in upper respiratory specimens during the acute phase of infection. The lowest concentration of SARS-CoV-2 viral copies this assay can detect is 138 copies/mL. A negative result does not preclude SARS-Cov-2 infection and should not be used as the sole basis for treatment or other patient management decisions. A negative result may occur with  improper specimen collection/handling, submission of specimen other than nasopharyngeal swab, presence of viral mutation(s) within the areas targeted by this assay, and inadequate number of viral copies(<138 copies/mL). A negative result must be combined with clinical observations, patient history, and epidemiological information. The expected result is Negative.  Fact Sheet for Patients:  EntrepreneurPulse.com.au  Fact Sheet for Healthcare Providers:  IncredibleEmployment.be  This test is no t yet approved or cleared by the [place] FDA and  has been authorized for detection and/or diagnosis of SARS-CoV-2 by FDA under an Emergency Use Authorization (EUA). This EUA will remain  in effect (meaning this test can be used) for the duration of the COVID-19 declaration under Section 564(b)(1) of the Act, 21 U.S.C.section 360bbb-3(b)(1), unless the authorization is terminated  or revoked sooner.       Influenza A by PCR NEGATIVE NEGATIVE Final   Influenza B by PCR NEGATIVE NEGATIVE Final    Comment: (NOTE) The Xpert Xpress SARS-CoV-2/FLU/RSV plus assay is intended as an aid in the diagnosis of influenza from Nasopharyngeal swab specimens and should not be used as a sole basis for treatment. Nasal washings and aspirates are unacceptable for Xpert Xpress SARS-CoV-2/FLU/RSV testing.  Fact Sheet for Patients: EntrepreneurPulse.com.au  Fact Sheet for Healthcare  Providers: IncredibleEmployment.be  This test is not yet approved or cleared by the [place] FDA and has been authorized for detection and/or diagnosis of SARS-CoV-2 by FDA under an Emergency Use Authorization (EUA). This EUA will remain in effect (meaning this test can be used) for the duration of the COVID-19 declaration under Section 564(b)(1) of the Act, 21 U.S.C. section 360bbb-3(b)(1), unless the authorization is terminated or revoked.  Performed at [facility], [address] [place],  [address]   MRSA Next Gen by PCR, Nasal     Status: None   Collection Time: 09/06/21  4:22 PM   Specimen: Nasal Mucosa; Nasal Swab  Result Value Ref Range Status   MRSA by PCR Next Gen NOT DETECTED NOT DETECTED Final    Comment: (NOTE) The GeneXpert MRSA Assay (FDA approved for NASAL specimens only), is one component of a  comprehensive MRSA colonization surveillance program. It is not intended to diagnose MRSA infection nor to guide or monitor treatment for MRSA infections. Test performance is not FDA approved in patients less than 86 years old. Performed at [facility], [place] [address] [place], [place] [address]          Radiology Studies: CT ABDOMEN PELVIS W CONTRAST  Result Date: 09/12/2021 CLINICAL DATA:  Abdominal pain. One week postop from sigmoid colectomy and end colostomy. EXAM: CT ABDOMEN AND PELVIS WITH CONTRAST TECHNIQUE: Multidetector CT imaging of the abdomen and pelvis was performed using the standard protocol following bolus administration of intravenous contrast. RADIATION DOSE REDUCTION: This exam was performed according to the departmental dose-optimization program which includes automated exposure control, adjustment of the mA and/or kV according to patient size and/or use of iterative reconstruction technique. CONTRAST:  21m OMNIPAQUE IOHEXOL 300 MG/ML  SOLN COMPARISON:  Noncontrast CT on 09/06/2021  FINDINGS: Lower Chest: Mild right basilar atelectasis and tiny right pleural effusion. Hepatobiliary: No hepatic masses identified. Gallbladder is unremarkable. No evidence of biliary ductal dilatation. Pancreas:  No mass or inflammatory changes. Spleen: Within normal limits in size and appearance. Adrenals/Urinary Tract: No masses identified. Tiny bilateral renal cysts incidentally noted (no followup imaging recommended). No evidence of ureteral calculi or hydronephrosis. Bladder largely obscured by severe by artifact from bilateral hip prostheses. Stomach/Bowel: Postop changes are seen from sigmoid colon resection with left lower quadrant colostomy. Surgical drain is seen within the pelvis. No evidence of abscess or free intraperitoneal air. Multiple mild-to-moderately dilated small bowel loops are seen without definite transition zone, likely due to postop ileus. Vascular/Lymphatic: No pathologically enlarged lymph nodes. No acute vascular findings. Aortic atherosclerotic calcification incidentally noted. Reproductive: Obscured by severe artifact from bilateral hip prostheses. Other:  None. Musculoskeletal:  No suspicious bone lesions identified. IMPRESSION: Postop changes from sigmoid colon resection with left lower quadrant colostomy. No evidence of abscess or free intraperitoneal air. Multiple mild-to-moderately dilated small bowel loops, likely due to postop ileus. Mild right basilar atelectasis and tiny right pleural effusion. Aortic Atherosclerosis (ICD10-I70.0). Electronically Signed   By: JMarlaine HindM.D.   On: 09/12/2021 14:42        Scheduled Meds:  docusate sodium  100 mg Oral BID   feeding supplement  237 mL Oral TID WC   heparin  5,000 Units Subcutaneous Q12H   insulin aspart  0-9 Units Subcutaneous TID WC   levothyroxine  75 mcg Oral QAC breakfast   mouth rinse  15 mL Mouth Rinse 4 times per day   pantoprazole (PROTONIX) IV  40 mg Intravenous QHS   Continuous Infusions:  sodium  chloride     sodium chloride 100 mL/hr at 09/14/21 1008   piperacillin-tazobactam (ZOSYN)  IV 2.25 g (09/14/21 1308)     LOS: 8 days    Time spent: 35 minutes  EGeorgette Shell MD  09/14/2021, 2:15 PM

## 2021-09-15 ENCOUNTER — Other Ambulatory Visit (HOSPITAL_COMMUNITY): Payer: Self-pay

## 2021-09-15 DIAGNOSIS — K562 Volvulus: Secondary | ICD-10-CM | POA: Diagnosis not present

## 2021-09-15 LAB — GLUCOSE, CAPILLARY: Glucose-Capillary: 144 mg/dL — ABNORMAL HIGH (ref 70–99)

## 2021-09-15 MED ORDER — ONDANSETRON 4 MG PO TBDP
4.0000 mg | ORAL_TABLET | Freq: Four times a day (QID) | ORAL | 0 refills | Status: DC | PRN
  Filled 2021-09-15: qty 20, 5d supply, fill #0

## 2021-09-15 MED ORDER — METOCLOPRAMIDE HCL 5 MG PO TABS
5.0000 mg | ORAL_TABLET | Freq: Three times a day (TID) | ORAL | 1 refills | Status: AC | PRN
  Filled 2021-09-15: qty 90, 30d supply, fill #0

## 2021-09-15 MED ORDER — LOSARTAN POTASSIUM 25 MG PO TABS
25.0000 mg | ORAL_TABLET | Freq: Every day | ORAL | 11 refills | Status: AC
  Filled 2021-09-15: qty 30, 30d supply, fill #0

## 2021-09-15 MED ORDER — DOCUSATE SODIUM 100 MG PO CAPS
100.0000 mg | ORAL_CAPSULE | Freq: Two times a day (BID) | ORAL | 0 refills | Status: AC
  Filled 2021-09-15: qty 10, 5d supply, fill #0

## 2021-09-15 MED ORDER — POLYETHYLENE GLYCOL 3350 17 G PO PACK
17.0000 g | PACK | Freq: Every day | ORAL | 0 refills | Status: AC | PRN
  Filled 2021-09-15: qty 14, 14d supply, fill #0

## 2021-09-15 NOTE — TOC Progression Note (Addendum)
Transition of Care [facility]) - Progression Note    Patient Details  Name: Jenna Thomas MRN: [id number] Date of Birth: 04-06-1931  [facility] CM/SW Contact  Leeroy Cha, RN Phone Number: 09/15/2021, 9:36 AM  Clinical Narrative:    Patient ok to be dcd to [facility].  Md alerted for dc summary. 1023/dc summary sent to [facility]. Transportation packet done and given to unit clerk. [id number]/transport arranged. Rn notified. Expected Discharge Plan: Home/Self Care Barriers to Discharge: Continued Medical Work up  Expected Discharge Plan and Services Expected Discharge Plan: Home/Self Care   Discharge Planning Services: CM Consult   Living arrangements for the past 2 months: [place] ([facility])                                       Social Determinants of Health (SDOH) Interventions    Readmission Risk Interventions     No data to display

## 2021-09-15 NOTE — Discharge Summary (Signed)
Physician Discharge Summary  Jenna Thomas PNT:[id number] DOB: 24-Dec-1931 DOA: 09/06/2021  PCP: Sueanne Margarita, DO  Admit date: 09/06/2021 Discharge date: 09/15/2021  [facility]  Disposition: [facility]  Recommendations for Outpatient Follow-up:  Follow up with PCP in 1-2 weeks Please obtain BMP/CBC in one week Please follow up with dr gerkin CBC CMP 09/19/2021 Check TSH in 3 weeks  Home Health:none Equipment/Devices:none  Discharge Condition:stable  CODE STATUS:full Diet recommendation: renal diet Brief/Interim Summary:88 y.o. female with medical history significant of hypothyroidism, HTN, came with worsening of abdominal pain.  CT scan of the abdomen pelvis on admission showed volvulus/malrotation, chronic obstruction versus ischemia, right-sided hydronephrosis.  General surgery consulted, she was taken urgently to the OR on 7/4 and was found to have infarcted sigmoid colon, status post sigmoid colectomy and end descending colostomy.     Discharge Diagnoses:  Principal Problem:   Bowel obstruction (HCC) Active Problems:   AKI (acute kidney injury) ([facility])   Hydronephrosis of right kidney   Acute metabolic encephalopathy   Stage 3a chronic kidney disease (CKD) (HCC)   Hypothyroidism   HTN (hypertension)    Infarcted sigmoid colon- she had ex lap, sigmoid colectomy and end descending colostomy.  Suspect due to diverticular disease.   Initially required NG tube but now this has been removed on 7/6.  She is able to tolerate her regular diet.  She completed 7-day course of Zosyn. Due to persistently elevated WBC, underwent a repeat CT scan on 7/10 without acute findings/abscesses.  Continue Zofran or Reglan for nausea.   Active problems Right-sided hydronephrosis-possibly due to dense pelvic adhesions from colonic process.  Status post sigmoid colectomy and end colostomy on 7/4.  Creatinine has improved and overall has remained stable and at her baseline.   Hydronephrosis resolved on the CT scan repeated   Acute metabolic encephalopathy-confused postoperatively, resolved.    AKI on CKD 3A-baseline creatinine around 1.4.  Check renal functions once a week.   Essential hypertension-at home she is on Aldactone and losartan/HCTZ, this has been stopped due to the fact she was dehydrated and she had elevated creatinine higher than her baseline.  I have restarted her only on losartan and stopped HCTZ and Aldactone which she was taking at home.   Hypothyroidism-continue Synthroid.  TSH 8.0, hard to interpret in the setting of an acute illness.  Repeat in 3 weeks as an outpatient   Estimated body mass index is 16.69 kg/m as calculated from the following:   Height as of this encounter: '5\' 5"'$  (1.651 m).   Weight as of this encounter: 45.5 kg.  Discharge Instructions  Discharge Instructions     Diet - low sodium heart healthy   Complete by: As directed    Diet - low sodium heart healthy   Complete by: As directed    Discharge wound care:   Complete by: As directed    See orders   Discharge wound care:   Complete by: As directed    See orders   Increase activity slowly   Complete by: As directed    Increase activity slowly   Complete by: As directed       Allergies as of 09/15/2021       Reactions   Sulfa Antibiotics Other (See Comments)   Does not remember reaction        Medication List     STOP taking these medications    losartan-hydrochlorothiazide 100-25 MG tablet Commonly known as: HYZAAR   spironolactone  25 MG tablet Commonly known as: ALDACTONE       TAKE these medications    bismuth subsalicylate 735 HG/99ME suspension Commonly known as: PEPTO BISMOL Take 15 mLs by mouth every 6 (six) hours as needed for indigestion. What changed: Another medication with the same name was removed. Continue taking this medication, and follow the directions you see here.   CALCIUM 600 + D PO Take 1 tablet by mouth 2 (two)  times daily.   docusate sodium 100 MG capsule Commonly known as: COLACE Take 1 capsule (100 mg total) by mouth 2 (two) times daily.   levothyroxine 75 MCG tablet Commonly known as: SYNTHROID Take 75 mcg by mouth daily before breakfast.   losartan 25 MG tablet Commonly known as: Cozaar Take 1 tablet (25 mg total) by mouth daily.   ondansetron 4 MG disintegrating tablet Commonly known as: ZOFRAN-ODT Take 1 tablet (4 mg total) by mouth every 6 (six) hours as needed for nausea.   polyethylene glycol 17 g packet Commonly known as: MIRALAX / GLYCOLAX Take 17 g by mouth daily as needed for mild constipation.   PreserVision AREDS 2 Caps Take 1 capsule by mouth 2 (two) times daily.               Discharge Care Instructions  (From admission, onward)           Start     Ordered   09/15/21 0000  Discharge wound care:       Comments: See orders   09/15/21 2683   09/15/21 0000  Discharge wound care:       Comments: See orders   09/15/21 0945            Follow-up Information     Armandina Gemma, MD. Go on 09/28/2021.   Specialty: General Surgery Why: Your appointment is 09/28/21 at 10:15am, Arrive 69mn early to check in, fill out paperwork, Bring photo ID and insurance information Contact information: [phone]        SSueanne Margarita DO Follow up.   Specialty: Internal Medicine Contact information: [address] [place] [phone]               Allergies  Allergen Reactions   Sulfa Antibiotics Other (See Comments)    Does not remember reaction    Consultations: surgery   Procedures/Studies: CT ABDOMEN PELVIS W CONTRAST  Result Date: 09/12/2021 CLINICAL DATA:  Abdominal pain. One week postop from sigmoid colectomy and end colostomy. EXAM: CT ABDOMEN AND PELVIS WITH CONTRAST TECHNIQUE: Multidetector CT imaging of the abdomen and pelvis was performed using the standard protocol  following bolus administration of intravenous contrast. RADIATION DOSE REDUCTION: This exam was performed according to the departmental dose-optimization program which includes automated exposure control, adjustment of the mA and/or kV according to patient size and/or use of iterative reconstruction technique. CONTRAST:  774mOMNIPAQUE IOHEXOL 300 MG/ML  SOLN COMPARISON:  Noncontrast CT on 09/06/2021 FINDINGS: Lower Chest: Mild right basilar atelectasis and tiny right pleural effusion. Hepatobiliary: No hepatic masses identified. Gallbladder is unremarkable. No evidence of biliary ductal dilatation. Pancreas:  No mass or inflammatory changes. Spleen: Within normal limits in size and appearance. Adrenals/Urinary Tract: No masses identified. Tiny bilateral renal cysts incidentally noted (no followup imaging recommended). No evidence of ureteral calculi or hydronephrosis. Bladder largely obscured by severe by artifact from bilateral hip prostheses. Stomach/Bowel: Postop changes are seen from sigmoid colon resection with  left lower quadrant colostomy. Surgical drain is seen within the pelvis. No evidence of abscess or free intraperitoneal air. Multiple mild-to-moderately dilated small bowel loops are seen without definite transition zone, likely due to postop ileus. Vascular/Lymphatic: No pathologically enlarged lymph nodes. No acute vascular findings. Aortic atherosclerotic calcification incidentally noted. Reproductive: Obscured by severe artifact from bilateral hip prostheses. Other:  None. Musculoskeletal:  No suspicious bone lesions identified. IMPRESSION: Postop changes from sigmoid colon resection with left lower quadrant colostomy. No evidence of abscess or free intraperitoneal air. Multiple mild-to-moderately dilated small bowel loops, likely due to postop ileus. Mild right basilar atelectasis and tiny right pleural effusion. Aortic Atherosclerosis (ICD10-I70.0). Electronically Signed   By: Marlaine Hind M.D.    On: 09/12/2021 14:42   DG Abd 1 View  Result Date: 09/07/2021 CLINICAL DATA:  NG tube placement. EXAM: ABDOMEN - 1 VIEW COMPARISON:  CT yesterday. FINDINGS: Tip and side port of the enteric tube below the diaphragm in the stomach. Moderate stool in the colon. IMPRESSION: Tip and side port of the enteric tube below the diaphragm in the stomach. Electronically Signed   By: Keith Rake M.D.   On: 09/07/2021 01:50   CT ABDOMEN PELVIS WO CONTRAST  Result Date: 09/06/2021 CLINICAL DATA:  Acute abdominal pain. Mid abdominal pain for 3 days. EXAM: CT ABDOMEN AND PELVIS WITHOUT CONTRAST TECHNIQUE: Multidetector CT imaging of the abdomen and pelvis was performed following the standard protocol without IV contrast. RADIATION DOSE REDUCTION: This exam was performed according to the departmental dose-optimization program which includes automated exposure control, adjustment of the mA and/or kV according to patient size and/or use of iterative reconstruction technique. COMPARISON:  CT abdomen dated 09/23/2018. FINDINGS: Lower chest: No acute abnormality. Hepatobiliary: No focal liver abnormality is seen. Gallbladder is unremarkable. No bile duct dilatation seen. Pancreas: Unremarkable. No pancreatic ductal dilatation or surrounding inflammatory changes. Spleen: Normal in size without focal abnormality. Adrenals/Urinary Tract: Adrenal glands appear normal. Moderate RIGHT-sided hydronephrosis. LEFT kidney is unremarkable without stone or hydronephrosis. No RIGHT-sided ureteral stone identified. There is a complex collection of soft tissue thickening and gas within the RIGHT hemipelvis, with surrounding inflammation/fluid/phlegmon, which appears to be causing the hydronephrosis. Stomach/Bowel: Extensive soft tissue thickening and inflammatory change involving the small bowel of the RIGHT upper pelvis and abutting sigmoid colon (axial series 2, images 50 through 60; coronal series 5, images 35 through 48). There is  tortuosity and narrowing of the small bowel in the RIGHT lower quadrant leading into this area of inflammation, raising the possibility of malrotation/volvulus or internal hernia with associated obstruction and/or ischemia. There is oral contrast within the sigmoid colon and within the inflamed-appearing small bowel loops overlying the sigmoid colon, also suspicious for enterocolic fistula. As above, this soft tissue thickening/inflammation and bowel wall thickening/inflammation is resulting in the moderate RIGHT-sided hydronephrosis. Extensive diverticulosis of the sigmoid colon in the LEFT pelvis. Distention of the transverse colon and LEFT colon, filled with stool suggesting some degree of associated colonic obstruction. Mild prominence of the small bowel in the RIGHT lower quadrant, also suggesting a partial obstruction. Stomach is unremarkable. Vascular/Lymphatic: Extensive aortic atherosclerosis. Reproductive: Presumed hysterectomy.  No adnexal mass identified. Other: No free intraperitoneal air identified. Musculoskeletal: Degenerative spondylosis of the slightly scoliotic thoracolumbar spine. No acute-appearing osseous abnormality. IMPRESSION: 1. Marked thickening and inflammatory change involving the small bowel of the RIGHT upper pelvis and the adjacent/underlying sigmoid colon. There is tortuosity and narrowing of the small bowel in this area of inflammation, and tortuosity  of the small bowel and mesentery of the overlying RIGHT lower quadrant, raising the possibility of malrotation/volvulus or internal hernia with resultant small bowel ischemia. 2. Additionally, there is oral contrast both within the abnormal-appearing sigmoid colon and within the inflamed-appearing small bowel loops abutting the sigmoid colon, suspicious for associated enterocolic fistula. 3. Distention of the transverse colon and LEFT colon, filled with stool, suggesting some degree of associated colonic obstruction. 4. Mildly dilated  small bowel within the RIGHT lower quadrant, also suggesting partial obstruction. 5. Extensive diverticulosis of the sigmoid colon in the LEFT pelvis. Aortic Atherosclerosis (ICD10-I70.0). If additional imaging characterization were needed, would consider repeat CT abdomen and pelvis with oral and rectal contrast. These results and recommendations were called by telephone at the time of interpretation on 09/06/2021 at 12:30 pm to provider [facility] RAY , who verbally acknowledged these results. Electronically Signed   By: Franki Cabot M.D.   On: 09/06/2021 12:40   DG Chest Port 1 View  Result Date: 09/06/2021 CLINICAL DATA:  Fatigue and diarrhea.  Questionable sepsis. EXAM: PORTABLE CHEST 1 VIEW COMPARISON:  03/09/2015 FINDINGS: The lungs are clear without focal pneumonia, edema, pneumothorax or pleural effusion. The cardiopericardial silhouette is within normal limits for size. The visualized bony structures of the thorax are unremarkable. Telemetry leads overlie the chest. IMPRESSION: No active disease. Electronically Signed   By: Misty Stanley M.D.   On: 09/06/2021 12:08   (Echo, Carotid, EGD, Colonoscopy, ERCP)    Subjective: Patient is resting in bed she feels better she is much more awake and alert denies nausea  Discharge Exam: Vitals:   09/14/21 2006 09/15/21 0523  BP: 131/76 (!) 145/71  Pulse: 85 88  Resp: 20 18  Temp: 98.3 F (36.8 C) (!) 97.5 F (36.4 C)  SpO2: 97% 94%   Vitals:   09/14/21 0441 09/14/21 0750 09/14/21 2006 09/15/21 0523  BP: 126/69 129/64 131/76 (!) 145/71  Pulse: 83 87 85 88  Resp: '18 17 20 18  '$ Temp: 97.8 F (36.6 C) 98.6 F (37 C) 98.3 F (36.8 C) (!) 97.5 F (36.4 C)  TempSrc: Oral Oral Oral Oral  SpO2: 98% 94% 97% 94%  Weight:      Height:        General: Pt is alert, awake, not in acute distress Cardiovascular: RRR, S1/S2 +, no rubs, no gallops Respiratory: CTA bilaterally, no wheezing, no rhonchi Abdominal: Soft, NT, ND, bowel sounds + colostomy  in place Extremities: no edema, no cyanosis    The results of significant diagnostics from this hospitalization (including imaging, microbiology, ancillary and laboratory) are listed below for reference.     Microbiology: Recent Results (from the past 240 hour(s))  Blood Culture (routine x 2)     Status: None   Collection Time: 09/06/21 11:23 AM   Specimen: BLOOD RIGHT FOREARM  Result Value Ref Range Status   Specimen Description   Final    BLOOD RIGHT FOREARM Performed at [facility], [address], [place], [place] [address]    Special Requests   Final    BOTTLES DRAWN AEROBIC AND ANAEROBIC Blood Culture results may not be optimal due to an inadequate volume of blood received in culture bottles Performed at [facility], [address], [place], [place] [address]    Culture   Final    NO GROWTH 5 DAYS Performed at [facility], [place] [address], [place], [place] [address]    Report Status 09/11/2021 FINAL  Final  Blood Culture (routine x 2)     Status: None   Collection Time: 09/06/21 11:30 AM   Specimen: BLOOD  Result Value Ref Range Status   Specimen Description   Final    BLOOD LEFT ANTECUBITAL Performed at [facility], [place] [address] [place], [place] [address]    Special Requests   Final    BOTTLES DRAWN AEROBIC AND ANAEROBIC Blood Culture adequate volume Performed at [facility], [address] [place], [place] [address]    Culture   Final    NO GROWTH 5 DAYS Performed at [facility], [place] [address], [place], [place] [address]    Report Status 09/11/2021 FINAL  Final  Resp Panel by RT-PCR (Flu A&B, Covid) Anterior Nasal Swab     Status: None   Collection Time: 09/06/21  2:38 PM   Specimen: Anterior Nasal Swab  Result Value Ref Range Status   SARS Coronavirus 2 by RT PCR NEGATIVE NEGATIVE Final    Comment: (NOTE) SARS-CoV-2 target nucleic acids are NOT DETECTED.  The SARS-CoV-2  RNA is generally detectable in upper respiratory specimens during the acute phase of infection. The lowest concentration of SARS-CoV-2 viral copies this assay can detect is 138 copies/mL. A negative result does not preclude SARS-Cov-2 infection and should not be used as the sole basis for treatment or other patient management decisions. A negative result may occur with  improper specimen collection/handling, submission of specimen other than nasopharyngeal swab, presence of viral mutation(s) within the areas targeted by this assay, and inadequate number of viral copies(<138 copies/mL). A negative result must be combined with clinical observations, patient history, and epidemiological information. The expected result is Negative.  Fact Sheet for Patients:  EntrepreneurPulse.com.au  Fact Sheet for Healthcare Providers:  IncredibleEmployment.be  This test is no t yet approved or cleared by the [place] FDA and  has been authorized for detection and/or diagnosis of SARS-CoV-2 by FDA under an Emergency Use Authorization (EUA). This EUA will remain  in effect (meaning this test can be used) for the duration of the COVID-19 declaration under Section 564(b)(1) of the Act, 21 U.S.C.section 360bbb-3(b)(1), unless the authorization is terminated  or revoked sooner.       Influenza A by PCR NEGATIVE NEGATIVE Final   Influenza B by PCR NEGATIVE NEGATIVE Final    Comment: (NOTE) The Xpert Xpress SARS-CoV-2/FLU/RSV plus assay is intended as an aid in the diagnosis of influenza from Nasopharyngeal swab specimens and should not be used as a sole basis for treatment. Nasal washings and aspirates are unacceptable for Xpert Xpress SARS-CoV-2/FLU/RSV testing.  Fact Sheet for Patients: EntrepreneurPulse.com.au  Fact Sheet for Healthcare Providers: IncredibleEmployment.be  This test is not yet approved or cleared by the  [place] FDA and has been authorized for detection and/or diagnosis of SARS-CoV-2 by FDA under an Emergency Use Authorization (EUA). This EUA will remain in effect (meaning this test can be used) for the duration of the COVID-19 declaration under Section 564(b)(1) of the Act, 21 U.S.C. section 360bbb-3(b)(1), unless the authorization is terminated or revoked.  Performed at [facility], [address], [place], [place] [address]   MRSA Next Gen by PCR, Nasal     Status: None   Collection Time: 09/06/21  4:22 PM   Specimen: Nasal Mucosa; Nasal Swab  Result Value Ref Range Status   MRSA by PCR Next Gen NOT DETECTED NOT DETECTED Final    Comment: (NOTE) The GeneXpert MRSA Assay (FDA  approved for NASAL specimens only), is one component of a comprehensive MRSA colonization surveillance program. It is not intended to diagnose MRSA infection nor to guide or monitor treatment for MRSA infections. Test performance is not FDA approved in patients less than 80 years old. Performed at [facility], [place] [address] [place], [place] [address]      Labs: BNP (last 3 results) No results for input(s): "BNP" in the last 8760 hours. Basic Metabolic Panel: Recent Labs  Lab 09/10/21 0429 09/11/21 0348 09/12/21 0403 09/13/21 0409 09/14/21 0423  NA 136 138 137 135 138  K 4.3 4.9 4.6 5.0 4.6  CL 105 106 106 102 102  CO2 '23 23 24 26 25  '$ GLUCOSE 138* 108* 130* 126* 139*  BUN 29* 27* 32* 28* 31*  CREATININE 1.11* 1.29* 1.30* 1.46* 1.72*  CALCIUM 7.1* 7.3* 7.6* 7.9* 9.1   Liver Function Tests: No results for input(s): "AST", "ALT", "ALKPHOS", "BILITOT", "PROT", "ALBUMIN" in the last 168 hours. No results for input(s): "LIPASE", "AMYLASE" in the last 168 hours. No results for input(s): "AMMONIA" in the last 168 hours. CBC: Recent Labs  Lab 09/10/21 0429 09/11/21 0348 09/12/21 0403 09/13/21 0409 09/14/21 0423  WBC 16.1* 16.9* 17.0* 17.2* 16.2*   HGB 10.2* 9.8* 10.1* 10.3* 11.2*  HCT 30.9* 31.2* 31.7* 31.9* 34.0*  MCV 100.7* 105.1* 102.9* 101.9* 100.0  PLT 360 357 408* 443* 576*   Cardiac Enzymes: No results for input(s): "CKTOTAL", "CKMB", "CKMBINDEX", "TROPONINI" in the last 168 hours. BNP: Invalid input(s): "POCBNP" CBG: Recent Labs  Lab 09/14/21 0748 09/14/21 1201 09/14/21 1713 09/14/21 2009 09/15/21 0735  GLUCAP 150* 161* 150* 139* 144*   D-Dimer No results for input(s): "DDIMER" in the last 72 hours. Hgb A1c No results for input(s): "HGBA1C" in the last 72 hours. Lipid Profile No results for input(s): "CHOL", "HDL", "LDLCALC", "TRIG", "CHOLHDL", "LDLDIRECT" in the last 72 hours. Thyroid function studies No results for input(s): "TSH", "T4TOTAL", "T3FREE", "THYROIDAB" in the last 72 hours.  Invalid input(s): "FREET3" Anemia work up No results for input(s): "VITAMINB12", "FOLATE", "FERRITIN", "TIBC", "IRON", "RETICCTPCT" in the last 72 hours. Urinalysis    Component Value Date/Time   COLORURINE YELLOW 09/14/2021 1406   APPEARANCEUR CLEAR 09/14/2021 1406   LABSPEC 1.023 09/14/2021 1406   PHURINE 5.0 09/14/2021 1406   GLUCOSEU NEGATIVE 09/14/2021 1406   HGBUR NEGATIVE 09/14/2021 1406   BILIRUBINUR NEGATIVE 09/14/2021 1406   KETONESUR NEGATIVE 09/14/2021 1406   PROTEINUR NEGATIVE 09/14/2021 1406   UROBILINOGEN 0.2 07/04/2009 0310   NITRITE NEGATIVE 09/14/2021 1406   LEUKOCYTESUR NEGATIVE 09/14/2021 1406   Sepsis Labs Recent Labs  Lab 09/11/21 0348 09/12/21 0403 09/13/21 0409 09/14/21 0423  WBC 16.9* 17.0* 17.2* 16.2*   Microbiology Recent Results (from the past 240 hour(s))  Blood Culture (routine x 2)     Status: None   Collection Time: 09/06/21 11:23 AM   Specimen: BLOOD RIGHT FOREARM  Result Value Ref Range Status   Specimen Description   Final    BLOOD RIGHT FOREARM Performed at [facility], [address], [place], [place] [address]    Special Requests   Final     BOTTLES DRAWN AEROBIC AND ANAEROBIC Blood Culture results may not be optimal due to an inadequate volume of blood received in culture bottles Performed at [facility], [address], [place],  [address]    Culture   Final    NO GROWTH 5 DAYS Performed at [facility],  [address], [place], [place] [address]    Report Status 09/11/2021 FINAL  Final  Blood Culture (routine x 2)     Status: None   Collection Time: 09/06/21 11:30 AM   Specimen: BLOOD  Result Value Ref Range Status   Specimen Description   Final    BLOOD LEFT ANTECUBITAL Performed at [facility], [place] [address] [place], [place] [address]    Special Requests   Final    BOTTLES DRAWN AEROBIC AND ANAEROBIC Blood Culture adequate volume Performed at [facility], [address], [place], [place] [address]    Culture   Final    NO GROWTH 5 DAYS Performed at [facility], [place] [address] [place], [place] [address]    Report Status 09/11/2021 FINAL  Final  Resp Panel by RT-PCR (Flu A&B, Covid) Anterior Nasal Swab     Status: None   Collection Time: 09/06/21  2:38 PM   Specimen: Anterior Nasal Swab  Result Value Ref Range Status   SARS Coronavirus 2 by RT PCR NEGATIVE NEGATIVE Final    Comment: (NOTE) SARS-CoV-2 target nucleic acids are NOT DETECTED.  The SARS-CoV-2 RNA is generally detectable in upper respiratory specimens during the acute phase of infection. The lowest concentration of SARS-CoV-2 viral copies this assay can detect is 138 copies/mL. A negative result does not preclude SARS-Cov-2 infection and should not be used as the sole basis for treatment or other patient management decisions. A negative result may occur with  improper specimen collection/handling, submission of specimen other than nasopharyngeal swab, presence of viral mutation(s) within the areas targeted by this assay, and inadequate number of viral copies(<138 copies/mL). A  negative result must be combined with clinical observations, patient history, and epidemiological information. The expected result is Negative.  Fact Sheet for Patients:  EntrepreneurPulse.com.au  Fact Sheet for Healthcare Providers:  IncredibleEmployment.be  This test is no t yet approved or cleared by the [place] FDA and  has been authorized for detection and/or diagnosis of SARS-CoV-2 by FDA under an Emergency Use Authorization (EUA). This EUA will remain  in effect (meaning this test can be used) for the duration of the COVID-19 declaration under Section 564(b)(1) of the Act, 21 U.S.C.section 360bbb-3(b)(1), unless the authorization is terminated  or revoked sooner.       Influenza A by PCR NEGATIVE NEGATIVE Final   Influenza B by PCR NEGATIVE NEGATIVE Final    Comment: (NOTE) The Xpert Xpress SARS-CoV-2/FLU/RSV plus assay is intended as an aid in the diagnosis of influenza from Nasopharyngeal swab specimens and should not be used as a sole basis for treatment. Nasal washings and aspirates are unacceptable for Xpert Xpress SARS-CoV-2/FLU/RSV testing.  Fact Sheet for Patients: EntrepreneurPulse.com.au  Fact Sheet for Healthcare Providers: IncredibleEmployment.be  This test is not yet approved or cleared by the [place] FDA and has been authorized for detection and/or diagnosis of SARS-CoV-2 by FDA under an Emergency Use Authorization (EUA). This EUA will remain in effect (meaning this test can be used) for the duration of the COVID-19 declaration under Section 564(b)(1) of the Act, 21 U.S.C. section 360bbb-3(b)(1), unless the authorization is terminated or revoked.  Performed at [facility], [address], [place], [place] [address]   MRSA Next Gen by PCR, Nasal     Status: None   Collection Time: 09/06/21  4:22 PM   Specimen: Nasal Mucosa; Nasal Swab  Result  Value Ref Range Status   MRSA by PCR  Next Gen NOT DETECTED NOT DETECTED Final    Comment: (NOTE) The GeneXpert MRSA Assay (FDA approved for NASAL specimens only), is one component of a comprehensive MRSA colonization surveillance program. It is not intended to diagnose MRSA infection nor to guide or monitor treatment for MRSA infections. Test performance is not FDA approved in patients less than 27 years old. Performed at [facility], [place] [address] [place], [place] [address]      Time coordinating discharge: 35 minutes  SIGNED:   Georgette Shell, MD  [facility] 09/15/2021, 9:46 AM

## 2021-09-15 NOTE — Plan of Care (Signed)
  Problem: Education: Goal: Knowledge of General Education information will improve Description: Including pain rating scale, medication(s)/side effects and non-pharmacologic comfort measures Outcome: Adequate for Discharge   Problem: Health Behavior/Discharge Planning: Goal: Ability to manage health-related needs will improve Outcome: Adequate for Discharge   Problem: Clinical Measurements: Goal: Ability to maintain clinical measurements within normal limits will improve Outcome: Adequate for Discharge Goal: Will remain free from infection Outcome: Adequate for Discharge Goal: Diagnostic test results will improve Outcome: Adequate for Discharge Goal: Respiratory complications will improve Outcome: Adequate for Discharge Goal: Cardiovascular complication will be avoided Outcome: Adequate for Discharge   Problem: Activity: Goal: Risk for activity intolerance will decrease Outcome: Adequate for Discharge   Problem: Nutrition: Goal: Adequate nutrition will be maintained Outcome: Adequate for Discharge   Problem: Coping: Goal: Level of anxiety will decrease Outcome: Adequate for Discharge   Problem: Elimination: Goal: Will not experience complications related to bowel motility Outcome: Adequate for Discharge Goal: Will not experience complications related to urinary retention Outcome: Adequate for Discharge   Problem: Pain Managment: Goal: General experience of comfort will improve Outcome: Adequate for Discharge   Problem: Safety: Goal: Ability to remain free from injury will improve Outcome: Adequate for Discharge   Problem: Skin Integrity: Goal: Risk for impaired skin integrity will decrease Outcome: Adequate for Discharge   Problem: Education: Goal: Ability to describe self-care measures that may prevent or decrease complications (Diabetes Survival Skills Education) will improve Outcome: Adequate for Discharge Goal: Individualized Educational Video(s) Outcome:  Adequate for Discharge   Problem: Coping: Goal: Ability to adjust to condition or change in health will improve Outcome: Adequate for Discharge   Problem: Fluid Volume: Goal: Ability to maintain a balanced intake and output will improve Outcome: Adequate for Discharge   Problem: Health Behavior/Discharge Planning: Goal: Ability to identify and utilize available resources and services will improve Outcome: Adequate for Discharge Goal: Ability to manage health-related needs will improve Outcome: Adequate for Discharge   Problem: Metabolic: Goal: Ability to maintain appropriate glucose levels will improve Outcome: Adequate for Discharge   Problem: Nutritional: Goal: Maintenance of adequate nutrition will improve Outcome: Adequate for Discharge Goal: Progress toward achieving an optimal weight will improve Outcome: Adequate for Discharge   Problem: Skin Integrity: Goal: Risk for impaired skin integrity will decrease Outcome: Adequate for Discharge   Problem: Tissue Perfusion: Goal: Adequacy of tissue perfusion will improve Outcome: Adequate for Discharge  Pt dc to outpt facility per MD order.

## 2021-09-15 NOTE — Progress Notes (Signed)
Patient ID: TENIOLA TSENG, female   DOB: 1931-11-15, 88 y.o.   MRN: [id number] [facility] Surgery Progress Note  9 Days Post-Op  Subjective: CC-  Tired this morning. Denies any abdominal pain, n/v. Appetite still suppressed. Colostomy functioning. Only ate 25-50% of her meals yesterday, but did drink Ensure x3.  Objective: Vital signs in last 24 hours: Temp:  [97.5 F (36.4 C)-98.3 F (36.8 C)] 97.5 F (36.4 C) (07/13 0523) Pulse Rate:  [85-88] 88 (07/13 0523) Resp:  [18-20] 18 (07/13 0523) BP: (131-145)/(71-76) 145/71 (07/13 0523) SpO2:  [94 %-97 %] 94 % (07/13 0523) Last BM Date : 09/14/21  Intake/Output from previous day: 07/12 0701 - 07/13 0700 In: 2719.8 [P.O.:760; I.V.:1867.1; IV Piggyback:92.8] Out: 200 [Urine:100; Stool:100] Intake/Output this shift: No intake/output data recorded.  PE: Gen:  Alert, NAD, pleasant Abd: Soft, min distention, nontender, midline cdi with staples present, stoma viable in LLQ with gas a soft brown stool in ostomy pouch, previous JP site with cdi dressing  Lab Results:  Recent Labs    09/13/21 0409 09/14/21 0423  WBC 17.2* 16.2*  HGB 10.3* 11.2*  HCT 31.9* 34.0*  PLT 443* 576*   BMET Recent Labs    09/13/21 0409 09/14/21 0423  NA 135 138  K 5.0 4.6  CL 102 102  CO2 26 25  GLUCOSE 126* 139*  BUN 28* 31*  CREATININE 1.46* 1.72*  CALCIUM 7.9* 9.1   PT/INR No results for input(s): "LABPROT", "INR" in the last 72 hours. CMP     Component Value Date/Time   NA 138 09/14/2021 0423   K 4.6 09/14/2021 0423   CL 102 09/14/2021 0423   CO2 25 09/14/2021 0423   GLUCOSE 139 (H) 09/14/2021 0423   BUN 31 (H) 09/14/2021 0423   CREATININE 1.72 (H) 09/14/2021 0423   CALCIUM 9.1 09/14/2021 0423   PROT 5.8 (L) 09/08/2021 0804   ALBUMIN 2.2 (L) 09/08/2021 0804   AST 18 09/08/2021 0804   ALT 18 09/08/2021 0804   ALKPHOS 60 09/08/2021 0804   BILITOT 0.9 09/08/2021 0804   GFRNONAA 28 (L) 09/14/2021 0423   GFRAA  07/06/2009  0420    >60        The eGFR has been calculated using the MDRD equation. This calculation has not been validated in all clinical situations. eGFR's persistently <60 mL/min signify possible Chronic Kidney Disease.   Lipase     Component Value Date/Time   LIPASE 13 09/06/2021 1025       Studies/Results: No results found.  Anti-infectives: Anti-infectives (From admission, onward)    Start     Dose/Rate Route Frequency Ordered Stop   09/13/21 1400  piperacillin-tazobactam (ZOSYN) IVPB 2.25 g        2.25 g 100 mL/hr over 30 Minutes Intravenous Every 8 hours 09/13/21 1003 09/14/21 2141   09/08/21 1800  piperacillin-tazobactam (ZOSYN) IVPB 3.375 g  Status:  Discontinued        3.375 g 12.5 mL/hr over 240 Minutes Intravenous Every 8 hours 09/08/21 1133 09/13/21 1002   09/07/21 1300  ceFEPIme (MAXIPIME) 2 g in sodium chloride 0.9 % 100 mL IVPB  Status:  Discontinued        2 g 200 mL/hr over 30 Minutes Intravenous Every 24 hours 09/06/21 1307 09/06/21 1943   09/06/21 2200  piperacillin-tazobactam (ZOSYN) IVPB 2.25 g  Status:  Discontinued        2.25 g 100 mL/hr over 30 Minutes Intravenous Every 8 hours 09/06/21 1943 09/08/21  1133   09/06/21 1230  ceFEPIme (MAXIPIME) 2 g in sodium chloride 0.9 % 100 mL IVPB        2 g 200 mL/hr over 30 Minutes Intravenous  Once 09/06/21 1229 09/06/21 1404   09/06/21 1230  metroNIDAZOLE (FLAGYL) IVPB 500 mg        500 mg 100 mL/hr over 60 Minutes Intravenous  Once 09/06/21 1229 09/06/21 1537        Assessment/Plan Infarcted sigmoid colon  -POD#9 S/p exploratory laparotomy, sigmoid colectomy, end descending colostomy 09/06/21 Dr. Harlow Asa - path c/w diverticular disease  - CT 7/10 showed mild ileus, no other intraabdominal complications or explanation for leukocytosis - JP drain removed 7/11 - WBC trending down (7/12). Afebrile - tolerating diet and colostomy functioning - no need for further abx from surgical standpoint - WOC RN following  for new ostomy. Will place referral to outpatient ostomy clinic - OOB, mobilize - ok for discharge today from surgical standpoint. Discharge instructions and follow up info on AVS   FEN: soft, ensure ID: Zosyn 7/4 >> 7/12 Foley: removed 7/6, voiding spontaneously VTE: SCD's, SQH   --per TRH-- AKI  Hypothyroidism HTN    LOS: 9 days    [place], [facility] Surgery 09/15/2021, 10:26 AM Please see Amion for pager number during day hours 7:00am-4:30pm

## 2021-10-12 ENCOUNTER — Encounter (HOSPITAL_COMMUNITY): Payer: Medicare Other

## 2021-10-12 ENCOUNTER — Other Ambulatory Visit (HOSPITAL_COMMUNITY): Payer: Self-pay

## 2021-11-04 ENCOUNTER — Other Ambulatory Visit (HOSPITAL_COMMUNITY): Payer: Self-pay | Admitting: *Deleted

## 2021-11-08 ENCOUNTER — Ambulatory Visit (HOSPITAL_COMMUNITY)
Admission: RE | Admit: 2021-11-08 | Discharge: 2021-11-08 | Disposition: A | Discharge status: Home / Self Care | Payer: Medicare Other | Source: Ambulatory Visit | Attending: Internal Medicine | Admitting: Internal Medicine

## 2021-11-08 DIAGNOSIS — M810 Age-related osteoporosis without current pathological fracture: Secondary | ICD-10-CM | POA: Insufficient documentation

## 2021-11-08 MED ORDER — DENOSUMAB 60 MG/ML ~~LOC~~ SOSY
PREFILLED_SYRINGE | SUBCUTANEOUS | Status: AC
  Filled 2021-11-08: qty 1

## 2021-11-08 MED ORDER — DENOSUMAB 60 MG/ML ~~LOC~~ SOSY
60.0000 mg | PREFILLED_SYRINGE | Freq: Once | SUBCUTANEOUS | Status: AC
  Administered 2021-11-08: 60 mg via SUBCUTANEOUS

## 2022-01-09 ENCOUNTER — Encounter: Payer: Self-pay | Admitting: *Deleted

## 2022-01-11 ENCOUNTER — Encounter: Payer: Self-pay | Admitting: Diagnostic Neuroimaging

## 2022-01-11 ENCOUNTER — Ambulatory Visit (INDEPENDENT_AMBULATORY_CARE_PROVIDER_SITE_OTHER): Payer: Medicare Other | Admitting: Diagnostic Neuroimaging

## 2022-01-11 VITALS — BP 154/69 | HR 75 | Ht 65.0 in | Wt 99.4 lb

## 2022-01-11 DIAGNOSIS — R413 Other amnesia: Secondary | ICD-10-CM | POA: Diagnosis not present

## 2022-01-11 DIAGNOSIS — F03A0 Unspecified dementia, mild, without behavioral disturbance, psychotic disturbance, mood disturbance, and anxiety: Secondary | ICD-10-CM | POA: Diagnosis not present

## 2022-01-11 NOTE — Patient Instructions (Addendum)
  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

## 2022-01-11 NOTE — Progress Notes (Signed)
[facility]  PATIENT: Jenna Thomas DOB: 06-20-1931  REFERRING CLINICIAN: Sueanne Margarita, DO HISTORY FROM: patient  REASON FOR VISIT: new consult    HISTORICAL  CHIEF COMPLAINT:  Chief Complaint  Patient presents with   Confusional state S/P hospitalization    Rm 6  New Pt son- Ulice Dash  "memory issues post anesthesia"     HISTORY OF PRESENT ILLNESS:   88 year old female here for evaluation memory loss.  Patient has had gradual onset progressive short-term memory loss, confusion for at least 1 year or longer according to family.  Mainly having issues recalling recent events, conversations, appointments.  Having more difficulty troubleshooting things at home such as her printer and computer.  Having some more challenges with managing finances.  She gave up driving over time as well.  In July she had ischemic bowel hospitalization, ICU stay, bowel resection and hospital associated delirium.  She had increasing delirium for 1 to 2 months following her hospitalization.  This gradually improved but her other underlying cognitive decline has continued.  As she is living in independent living at [place] [facility].  She has aides in her home most of the daytime.  One of her sons helps with finances.  She no longer drives.  She exercises 5 days a week.  She is able collector and likes to read on daily basis.   REVIEW OF SYSTEMS: Full 14 system review of systems performed and negative with exception of: as per HPI.  ALLERGIES: Allergies  Allergen Reactions   Sulfa Antibiotics Other (See Comments)    Does not remember reaction    HOME MEDICATIONS: Outpatient Medications Prior to Visit  Medication Sig Dispense Refill   Calcium Carb-Cholecalciferol (CALCIUM 600 + D PO) Take 1 tablet by mouth 2 (two) times daily.     denosumab (PROLIA) 60 MG/ML SOSY injection Inject 60 mg into the skin every 6 (six) months.     docusate sodium (COLACE) 100 MG capsule Take 1 capsule (100 mg  total) by mouth 2 (two) times daily. 10 capsule 0   hydrochlorothiazide (HYDRODIURIL) 25 MG tablet Take 12.5 mg by mouth daily.     levothyroxine (SYNTHROID, LEVOTHROID) 75 MCG tablet Take 75 mcg by mouth daily before breakfast.      losartan (COZAAR) 25 MG tablet Take 1 tablet (25 mg total) by mouth daily. 30 tablet 11   Multiple Vitamins-Minerals (PRESERVISION AREDS 2) CAPS Take 1 capsule by mouth 2 (two) times daily.     polyethylene glycol (MIRALAX / GLYCOLAX) 17 g packet Take 17 g by mouth daily as needed for mild constipation. 14 each 0   UNABLE TO FIND Med Name: fiber con twice daily     metoCLOPramide (REGLAN) 5 MG tablet Take 1 tablet (5 mg total) by mouth every 8 (eight) hours as needed for nausea. (Patient not taking: Reported on 01/11/2022) 90 tablet 1   bismuth subsalicylate (PEPTO BISMOL) 262 MG/15ML suspension Take 15 mLs by mouth every 6 (six) hours as needed for indigestion.     ondansetron (ZOFRAN-ODT) 4 MG disintegrating tablet Dissolve 1 tablet by mouth every 6 (six) hours as needed for nausea. 20 tablet 0   No facility-administered medications prior to visit.    PAST MEDICAL HISTORY: Past Medical History:  Diagnosis Date   Arthritis    back   Endometrial polyp    Hypertension    Hypothyroidism    Macular degeneration of both eyes    Osteopenia    PMB (postmenopausal bleeding)  Wears glasses     PAST SURGICAL HISTORY: Past Surgical History:  Procedure Laterality Date   CATARACT EXTRACTION W/ INTRAOCULAR LENS  IMPLANT, BILATERAL  2012   COLONOSCOPY  last one 2014   [place] N/A 10/24/2018   Procedure: [place];  Surgeon: Molli Posey, MD;  Location: [facility];  Service: Gynecology;  Laterality: N/A;   LAPAROTOMY N/A 09/06/2021   Procedure: EXPLORATORY LAPAROTOMY, SIGMOID COLECTOMY, END COLOSTOMY;  Surgeon: Armandina Gemma, MD;  Location: [facility];  Service: General;   Laterality: N/A;   TOTAL HIP ARTHROPLASTY Bilateral left 12-18-2005;  right 07-04-2009  dr Alvan Dame _0     FAMILY HISTORY: Family History  Problem Relation Age of Onset   Heart disease Mother    Heart disease Father    Bladder Cancer Brother        former smoker   Colon cancer Neg Hx    Esophageal cancer Neg Hx    Rectal cancer Neg Hx    Stomach cancer Neg Hx    Breast cancer Neg Hx     SOCIAL HISTORY: Social History   Socioeconomic History   Marital status: Widowed    Spouse name: Not on file   Number of children: 4   Years of education: 59   Highest education level: Not on file  Occupational History   Occupation: retired    Comment: Clinical cytogeneticist  Tobacco Use   Smoking status: Former    Years: 40.00    Types: Cigarettes    Quit date: 03/07/1995    Years since quitting: 26.8   Smokeless tobacco: Never  Vaping Use   Vaping Use: Never used  Substance and Sexual Activity   Alcohol use: Yes    Alcohol/week: 3.0 standard drinks of alcohol    Types: 3 Glasses of wine per week    Comment: rare wine   Drug use: Never   Sexual activity: Not on file  Other Topics Concern   Not on file  Social History Narrative   01/09/22 lives at [facility]   No caffeine   Social Determinants of Health   Financial Resource Strain: Not on file  Food Insecurity: Not on file  Transportation Needs: Not on file  Physical Activity: Not on file  Stress: Not on file  Social Connections: Not on file  Intimate Partner Violence: Not on file     PHYSICAL EXAM  GENERAL EXAM/CONSTITUTIONAL: Vitals:  Vitals:   01/11/22 1223  BP: (!) 154/69  Pulse: 75  Weight: 99 lb 6.4 oz (45.1 kg)  Height: _1  (1.651 m)   Body mass index is 16.54 kg/m. Wt Readings from Last 3 Encounters:  01/11/22 99 lb 6.4 oz (45.1 kg)  09/06/21 100 lb 5 oz (45.5 kg)  12/12/18 102 lb (46.3 kg)   Patient is in no distress; well developed, nourished and groomed; neck is  supple  CARDIOVASCULAR: Examination of carotid arteries is normal; no carotid bruits Regular rate and rhythm, no murmurs Examination of peripheral vascular system by observation and palpation is normal  EYES: Ophthalmoscopic exam of optic discs and posterior segments is normal; no papilledema or hemorrhages No results found.  MUSCULOSKELETAL: Gait, strength, tone, movements noted in Neurologic exam below  NEUROLOGIC: MENTAL STATUS:      No data to display         awake, alert, oriented to person remote memory intact language fluent, comprehension intact, naming intact  CRANIAL NERVE:  2nd -  no papilledema on fundoscopic exam 2nd, 3rd, 4th, 6th - pupils equal and reactive to light, visual fields full to confrontation, extraocular muscles intact, no nystagmus 5th - facial sensation symmetric 7th - facial strength symmetric 8th - hearing intact 9th - palate elevates symmetrically, uvula midline 11th - shoulder shrug symmetric 12th - tongue protrusion midline  MOTOR:  normal bulk and tone, full strength in the BUE, BLE  SENSORY:  normal and symmetric to light touch, temperature, vibration  COORDINATION:  finger-nose-finger, fine finger movements normal  REFLEXES:  deep tendon reflexes present and symmetric  GAIT/STATION:  narrow based gait     DIAGNOSTIC DATA (LABS, IMAGING, TESTING) - I reviewed patient records, labs, notes, testing and imaging myself where available.  Lab Results  Component Value Date   WBC 16.2 (H) 09/14/2021   HGB 11.2 (L) 09/14/2021   HCT 34.0 (L) 09/14/2021   MCV 100.0 09/14/2021   PLT 576 (H) 09/14/2021      Component Value Date/Time   NA 138 09/14/2021 0423   K 4.6 09/14/2021 0423   CL 102 09/14/2021 0423   CO2 25 09/14/2021 0423   GLUCOSE 139 (H) 09/14/2021 0423   BUN 31 (H) 09/14/2021 0423   CREATININE 1.72 (H) 09/14/2021 0423   CALCIUM 9.1 09/14/2021 0423   PROT 5.8 (L) 09/08/2021 0804   ALBUMIN 2.2 (L) 09/08/2021  0804   AST 18 09/08/2021 0804   ALT 18 09/08/2021 0804   ALKPHOS 60 09/08/2021 0804   BILITOT 0.9 09/08/2021 0804   GFRNONAA 28 (L) 09/14/2021 0423   GFRAA  07/06/2009 0420    >60        The eGFR has been calculated using the MDRD equation. This calculation has not been validated in all clinical situations. eGFR's persistently <60 mL/min signify possible Chronic Kidney Disease.   No results found for: "CHOL", "HDL", "LDLCALC", "LDLDIRECT", "TRIG", "CHOLHDL" Lab Results  Component Value Date   HGBA1C 5.9 (H) 09/06/2021   No results found for: "VITAMINB12" Lab Results  Component Value Date   TSH 8.025 (H) 09/06/2021       ASSESSMENT AND PLAN  88 y.o. year old female here with:   Dx:  1. Memory loss   2. 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  I spent 60 minutes of face-to-face and non-face-to-face time with patient.  This included previsit chart review, lab review, study review, order entry, electronic health record documentation, patient education.     Penni Bombard, MD 65/09/8467, 6:29 PM Certified in Neurology, Neurophysiology and Neuroimaging  [facility] [address], [place],   [id number] [phone]

## 2022-01-18 ENCOUNTER — Emergency Department (HOSPITAL_COMMUNITY)
Admission: EM | Admit: 2022-01-18 | Discharge: 2022-01-18 | Disposition: A | Discharge status: Home / Self Care | Payer: Medicare Other | Attending: Emergency Medicine | Admitting: Emergency Medicine

## 2022-01-18 ENCOUNTER — Other Ambulatory Visit: Payer: Self-pay

## 2022-01-18 ENCOUNTER — Encounter (HOSPITAL_COMMUNITY): Payer: Self-pay

## 2022-01-18 DIAGNOSIS — I10 Essential (primary) hypertension: Secondary | ICD-10-CM | POA: Insufficient documentation

## 2022-01-18 DIAGNOSIS — Z79899 Other long term (current) drug therapy: Secondary | ICD-10-CM | POA: Diagnosis not present

## 2022-01-18 DIAGNOSIS — R198 Other specified symptoms and signs involving the digestive system and abdomen: Secondary | ICD-10-CM | POA: Diagnosis present

## 2022-01-18 DIAGNOSIS — K435 Parastomal hernia without obstruction or  gangrene: Secondary | ICD-10-CM | POA: Insufficient documentation

## 2022-01-18 NOTE — ED Provider Triage Note (Signed)
Emergency Medicine Provider Triage Evaluation Note  Jenna Thomas , a 88 y.o. female  was evaluated in triage.  Pt complains of problem with ostomy.  Is with caregiver who reports a new ostomy placed in July.  This morning when she cared for her she noticed that the stoma was protruding at the top and it appeared like a grape fruit.  Patient have any pain, nausea, or vomiting at that time.  Caregiver says she put the bag back on was able to reduce the stoma which also improved when patient laid flat.  Patient has no other complaints at this time  Review of Systems  Positive:  Negative:   Physical Exam  BP (!) 154/68 (BP Location: Right Arm)   Pulse 71   Temp 98.1 F (36.7 C) (Oral)   Resp 16   SpO2 100%  Gen:   Awake, no distress   Resp:  Normal effort  MSK:   Moves extremities without difficulty  Other:  Ostomy bag without leaking. I did not take bag off, but stoma appears normal. No abdominal tenderness or distension  Medical Decision Making  Medically screening exam initiated at 10:42 AM.  Appropriate orders placed.  Emeri Estill Slaby was informed that the remainder of the evaluation will be completed by another provider, this initial triage assessment does not replace that evaluation, and the importance of remaining in the ED until their evaluation is complete.     Adolphus Birchwood, PA-C 01/18/22 1043

## 2022-01-18 NOTE — Discharge Instructions (Signed)
Appears that you have a new hernia today above your ostomy.  There is nothing emergently to be done about this but you should follow-up in the surgery office.  If you start having vomiting, severe abdominal pain, no output from your ostomy you should return to the emergency room immediately.

## 2022-01-18 NOTE — ED Triage Notes (Signed)
New ostomy placed in July and this am woke with larger stoma then normal.  Denies pain, constipation.

## 2022-01-18 NOTE — ED Provider Notes (Signed)
[facility] DEPT Provider Note   CSN: [id number] Arrival date & time: 01/18/22  6812     History  Chief Complaint  Patient presents with   GI Problem    Jenna Thomas is a 88 y.o. female.  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  Patient denied any pain in her ostomy, nausea or vomiting.  She has not had any further output since this happened but did have a very large bowel movement prior to that.  The history is provided by the patient and a caregiver.  GI Problem       Home Medications Prior to Admission medications   Medication Sig Start Date End Date Taking? Authorizing Provider  Calcium Carb-Cholecalciferol (CALCIUM 600 + D PO) Take 1 tablet by mouth 2 (two) times daily.    [provider]  denosumab (PROLIA) 60 MG/ML SOSY injection Inject 60 mg into the skin every 6 (six) months.    [provider]  docusate sodium (COLACE) 100 MG capsule Take 1 capsule (100 mg total) by mouth 2 (two) times daily. 09/15/21   Georgette Shell, MD  hydrochlorothiazide (HYDRODIURIL) 25 MG tablet Take 12.5 mg by mouth daily.    [provider]  levothyroxine (SYNTHROID, LEVOTHROID) 75 MCG tablet Take 75 mcg by mouth daily before breakfast.     [provider]  losartan (COZAAR) 25 MG tablet Take 1 tablet (25 mg total) by mouth daily. 09/15/21 09/15/22  Georgette Shell, MD  metoCLOPramide (REGLAN) 5 MG tablet Take 1 tablet (5 mg total) by mouth every 8 (eight) hours as needed for nausea. Patient not taking: Reported on 01/11/2022 09/15/21 09/15/22  Georgette Shell, MD  Multiple Vitamins-Minerals (PRESERVISION AREDS 2) CAPS Take 1 capsule by mouth 2 (two) times daily.     [provider]  polyethylene glycol (MIRALAX / GLYCOLAX) 17 g packet Take 17 g by mouth daily as needed for mild constipation. 09/15/21   Georgette Shell, MD  UNABLE TO FIND Med Name: fiber con twice daily    [provider]      Allergies    Sulfa antibiotics    Review of Systems   Review of Systems  Physical Exam Updated Vital Signs BP 120/68   Pulse 60   Temp 98 F (36.7 C)   Resp 18   SpO2 100%  Physical Exam Vitals and nursing note reviewed.  Constitutional:      General: She is not in acute distress.    Appearance: She is well-developed.  HENT:     Head: Normocephalic and atraumatic.  Eyes:     Pupils: Pupils are equal, round, and reactive to light.  Cardiovascular:     Rate and Rhythm: Normal rate and regular rhythm.     Heart sounds: Normal heart sounds. No murmur heard.    No friction rub.  Pulmonary:     Effort: Pulmonary effort is normal.     Breath sounds: Normal breath sounds. No wheezing or rales.  Abdominal:     General: Bowel sounds are normal. There is no distension.     Palpations: Abdomen is soft.     Tenderness: There is no abdominal tenderness. There is no guarding or rebound.  Comments: Bulging present above the ostomy site with palpable bowel that is nontender.  No prolapsed ostomy.  No bowel is present in the bag currently  Musculoskeletal:        General: No tenderness. Normal range of motion.     Comments: No edema  Skin:    General: Skin is warm and dry.     Findings: No rash.  Neurological:     Mental Status: She is alert and oriented to person, place, and time.     Cranial Nerves: No cranial nerve deficit.  Psychiatric:        Behavior: Behavior normal.     ED Results / Procedures / Treatments   Labs (all labs ordered are listed, but only abnormal results are displayed) Labs Reviewed - No data to display  EKG None  Radiology No results found.  Procedures Procedures    Medications Ordered in  ED Medications - No data to display  ED Course/ Medical Decision Making/ A&P                           Medical Decision Making Amount and/or Complexity of Data Reviewed Independent Historian: caregiver External Data Reviewed: notes.   Patient is presenting today due to change around her ostomy.  She did have a very large bowel movement but since that time when she is standing they have noticed bulging on the upper part of her shin of her ostomy.  She has no evidence of prolapse today and there have been no symptoms suggestive of obstruction such as no output from the ostomy or nausea and vomiting.  Patient's abdomen is soft but there is a bulge noted on the upper part above her ostomy.  Bowel contents are palpated and reduced.  However as soon as she stands they return.  Will discuss with general surgery.  Concern for possible hernia near the ostomy site. 3:22 PM Spoke with Dr. Ninfa Linden and patient most likely has a parastomal hernia.  At this time there is no signs of obstruction.  She does not need any acute imaging at this time but just follow-up in the surgery clinic.  This was relayed to the patient and her caregiver and family member.  She was given return precautions but at this time is stable for discharge.        Final Clinical Impression(s) / ED Diagnoses Final diagnoses:  Parastomal hernia without obstruction or gangrene    Rx / DC Orders ED Discharge Orders     None         Blanchie Dessert, MD 01/18/22 [id number]

## 2022-05-08 ENCOUNTER — Other Ambulatory Visit (HOSPITAL_COMMUNITY): Payer: Self-pay | Admitting: *Deleted

## 2022-05-10 ENCOUNTER — Encounter (HOSPITAL_COMMUNITY)
Admission: RE | Admit: 2022-05-10 | Discharge: 2022-05-10 | Disposition: A | Discharge status: Home / Self Care | Payer: Medicare Other | Source: Ambulatory Visit | Attending: Internal Medicine | Admitting: Internal Medicine

## 2022-05-10 DIAGNOSIS — M810 Age-related osteoporosis without current pathological fracture: Secondary | ICD-10-CM | POA: Diagnosis not present

## 2022-05-10 MED ORDER — DENOSUMAB 60 MG/ML ~~LOC~~ SOSY
60.0000 mg | PREFILLED_SYRINGE | Freq: Once | SUBCUTANEOUS | Status: AC
  Administered 2022-05-10: 60 mg via SUBCUTANEOUS

## 2022-05-10 MED ORDER — DENOSUMAB 60 MG/ML ~~LOC~~ SOSY
PREFILLED_SYRINGE | SUBCUTANEOUS | Status: AC
  Filled 2022-05-10: qty 1

## 2022-11-10 ENCOUNTER — Other Ambulatory Visit (HOSPITAL_COMMUNITY): Payer: Self-pay | Admitting: *Deleted

## 2022-11-14 ENCOUNTER — Encounter (HOSPITAL_COMMUNITY)
Admission: RE | Admit: 2022-11-14 | Discharge: 2022-11-14 | Disposition: A | Discharge status: Home / Self Care | Payer: Medicare Other | Source: Ambulatory Visit | Attending: Internal Medicine | Admitting: Internal Medicine

## 2022-11-14 DIAGNOSIS — M810 Age-related osteoporosis without current pathological fracture: Secondary | ICD-10-CM | POA: Insufficient documentation

## 2022-11-14 MED ORDER — DENOSUMAB 60 MG/ML ~~LOC~~ SOSY
60.0000 mg | PREFILLED_SYRINGE | Freq: Once | SUBCUTANEOUS | Status: AC
  Administered 2022-11-14: 60 mg via SUBCUTANEOUS

## 2022-11-14 MED ORDER — DENOSUMAB 60 MG/ML ~~LOC~~ SOSY
PREFILLED_SYRINGE | SUBCUTANEOUS | Status: AC
  Filled 2022-11-14: qty 1

## 2023-06-26 ENCOUNTER — Other Ambulatory Visit (HOSPITAL_COMMUNITY): Payer: Self-pay | Admitting: *Deleted

## 2023-06-28 ENCOUNTER — Ambulatory Visit (HOSPITAL_COMMUNITY)
Admission: RE | Admit: 2023-06-28 | Discharge: 2023-06-28 | Disposition: A | Discharge status: Home / Self Care | Source: Ambulatory Visit | Attending: Internal Medicine | Admitting: Internal Medicine

## 2023-06-28 DIAGNOSIS — M810 Age-related osteoporosis without current pathological fracture: Secondary | ICD-10-CM | POA: Insufficient documentation

## 2023-06-28 MED ORDER — DENOSUMAB 60 MG/ML ~~LOC~~ SOSY
60.0000 mg | PREFILLED_SYRINGE | Freq: Once | SUBCUTANEOUS | Status: AC
  Administered 2023-06-28: 60 mg via SUBCUTANEOUS

## 2023-06-28 MED ORDER — DENOSUMAB 60 MG/ML ~~LOC~~ SOSY
PREFILLED_SYRINGE | SUBCUTANEOUS | Status: AC
  Filled 2023-06-28: qty 1

## 2023-12-20 ENCOUNTER — Other Ambulatory Visit (HOSPITAL_COMMUNITY): Payer: Self-pay | Admitting: Internal Medicine

## 2023-12-20 DIAGNOSIS — M810 Age-related osteoporosis without current pathological fracture: Secondary | ICD-10-CM | POA: Insufficient documentation

## 2023-12-21 ENCOUNTER — Telehealth: Payer: Self-pay | Admitting: Pharmacy Technician

## 2023-12-21 ENCOUNTER — Encounter (HOSPITAL_COMMUNITY): Payer: Self-pay | Admitting: Internal Medicine

## 2023-12-21 NOTE — Telephone Encounter (Signed)
 Auth Submission: NO AUTH NEEDED Site of care: Site of care: [facility] Payer: MEDICARE A/B  Medication & CPT/J Code(s) submitted: Prolia  (Denosumab ) J0897/ BIOSIMILIARS/JUBBONTI Diagnosis Code: M81.0 Route of submission (phone, fax, portal):  Phone # Fax # Auth type: Buy/Bill HB Units/visits requested: X1 DOSE Reference number:  Approval from: 12/21/23 to 04/05/24

## 2024-01-28 ENCOUNTER — Ambulatory Visit (HOSPITAL_COMMUNITY)
Admission: RE | Admit: 2024-01-28 | Discharge: 2024-01-28 | Disposition: A | Discharge status: Home / Self Care | Source: Ambulatory Visit | Attending: Internal Medicine | Admitting: Internal Medicine

## 2024-01-28 DIAGNOSIS — M810 Age-related osteoporosis without current pathological fracture: Secondary | ICD-10-CM | POA: Insufficient documentation

## 2024-01-28 MED ORDER — DENOSUMAB 60 MG/ML ~~LOC~~ SOSY
60.0000 mg | PREFILLED_SYRINGE | Freq: Once | SUBCUTANEOUS | Status: AC
  Administered 2024-01-28: 60 mg via SUBCUTANEOUS

## 2024-01-28 MED ORDER — DENOSUMAB 60 MG/ML ~~LOC~~ SOSY
PREFILLED_SYRINGE | SUBCUTANEOUS | Status: AC
  Filled 2024-01-28: qty 1

## 2024-07-29 ENCOUNTER — Encounter (HOSPITAL_COMMUNITY)
